# Patient Record
Sex: Female | Born: 1980 | Race: Black or African American | Hispanic: No | Marital: Single | Smoking: Former smoker
Health system: Southern US, Community
[De-identification: ages and names within clinical notes are randomized; demographics above are authoritative.]

## PROBLEM LIST (undated history)

## (undated) DIAGNOSIS — L509 Urticaria, unspecified: Secondary | ICD-10-CM

## (undated) DIAGNOSIS — I10 Essential (primary) hypertension: Secondary | ICD-10-CM

## (undated) HISTORY — PX: REFRACTIVE SURGERY: SHX103

## (undated) HISTORY — DX: Essential (primary) hypertension: I10

## (undated) HISTORY — DX: Urticaria, unspecified: L50.9

---
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
## 2001-01-20 ENCOUNTER — Other Ambulatory Visit: Admission: RE | Admit: 2001-01-20 | Discharge: 2001-01-20 | Payer: Self-pay | Admitting: Obstetrics and Gynecology

## 2001-06-14 ENCOUNTER — Encounter: Admission: RE | Admit: 2001-06-14 | Discharge: 2001-06-14 | Payer: Self-pay | Admitting: Obstetrics and Gynecology

## 2001-07-28 ENCOUNTER — Inpatient Hospital Stay (HOSPITAL_COMMUNITY): Admission: AD | Admit: 2001-07-28 | Discharge: 2001-07-31 | Payer: Self-pay | Admitting: Obstetrics and Gynecology

## 2001-07-28 ENCOUNTER — Encounter (INDEPENDENT_AMBULATORY_CARE_PROVIDER_SITE_OTHER): Payer: Self-pay | Admitting: *Deleted

## 2001-07-30 ENCOUNTER — Encounter (INDEPENDENT_AMBULATORY_CARE_PROVIDER_SITE_OTHER): Payer: Self-pay | Admitting: Specialist

## 2001-08-02 HISTORY — PX: COLPOSCOPY: SHX161

## 2002-02-02 ENCOUNTER — Other Ambulatory Visit: Admission: RE | Admit: 2002-02-02 | Discharge: 2002-02-02 | Payer: Self-pay | Admitting: Obstetrics and Gynecology

## 2003-03-01 ENCOUNTER — Other Ambulatory Visit: Admission: RE | Admit: 2003-03-01 | Discharge: 2003-03-01 | Payer: Self-pay | Admitting: Family Medicine

## 2003-03-22 ENCOUNTER — Inpatient Hospital Stay (HOSPITAL_COMMUNITY): Admission: AD | Admit: 2003-03-22 | Discharge: 2003-03-22 | Payer: Self-pay | Admitting: Obstetrics and Gynecology

## 2003-05-17 ENCOUNTER — Emergency Department (HOSPITAL_COMMUNITY): Admission: EM | Admit: 2003-05-17 | Discharge: 2003-05-17 | Payer: Self-pay | Admitting: Emergency Medicine

## 2003-05-17 ENCOUNTER — Encounter: Payer: Self-pay | Admitting: Emergency Medicine

## 2003-09-20 ENCOUNTER — Emergency Department (HOSPITAL_COMMUNITY): Admission: EM | Admit: 2003-09-20 | Discharge: 2003-09-20 | Payer: Self-pay

## 2004-03-04 ENCOUNTER — Other Ambulatory Visit: Admission: RE | Admit: 2004-03-04 | Discharge: 2004-03-04 | Payer: Self-pay | Admitting: Family Medicine

## 2004-07-23 ENCOUNTER — Emergency Department (HOSPITAL_COMMUNITY): Admission: EM | Admit: 2004-07-23 | Discharge: 2004-07-23 | Payer: Self-pay | Admitting: Family Medicine

## 2005-03-27 ENCOUNTER — Other Ambulatory Visit: Admission: RE | Admit: 2005-03-27 | Discharge: 2005-03-27 | Payer: Self-pay | Admitting: Family Medicine

## 2006-04-14 ENCOUNTER — Other Ambulatory Visit: Admission: RE | Admit: 2006-04-14 | Discharge: 2006-04-14 | Payer: Self-pay | Admitting: Family Medicine

## 2006-12-02 ENCOUNTER — Emergency Department (HOSPITAL_COMMUNITY): Admission: EM | Admit: 2006-12-02 | Discharge: 2006-12-02 | Payer: Self-pay | Admitting: Emergency Medicine

## 2007-05-02 ENCOUNTER — Other Ambulatory Visit: Admission: RE | Admit: 2007-05-02 | Discharge: 2007-05-02 | Payer: Self-pay | Admitting: Family Medicine

## 2008-07-06 ENCOUNTER — Emergency Department (HOSPITAL_COMMUNITY): Admission: EM | Admit: 2008-07-06 | Discharge: 2008-07-06 | Payer: Self-pay | Admitting: Family Medicine

## 2008-11-28 ENCOUNTER — Emergency Department (HOSPITAL_COMMUNITY): Admission: EM | Admit: 2008-11-28 | Discharge: 2008-11-28 | Payer: Self-pay | Admitting: Emergency Medicine

## 2009-05-06 ENCOUNTER — Other Ambulatory Visit: Admission: RE | Admit: 2009-05-06 | Discharge: 2009-05-06 | Payer: Self-pay | Admitting: Family Medicine

## 2009-10-25 ENCOUNTER — Emergency Department (HOSPITAL_COMMUNITY): Admission: EM | Admit: 2009-10-25 | Discharge: 2009-10-25 | Payer: Self-pay | Admitting: Family Medicine
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
## 2010-05-16 ENCOUNTER — Other Ambulatory Visit: Admission: RE | Admit: 2010-05-16 | Discharge: 2010-05-16 | Payer: Self-pay | Admitting: Family Medicine

## 2010-12-19 NOTE — Discharge Summary (Signed)
[facility]  Patient:    Sylvia Roberts, Sylvia Roberts Visit Number: [id number] MRN: [id number]          Service Type: OBS Location: [facility] Attending Physician:  Leonard Schwartz Dictated by:   Wynelle Bourgeois, CNM Adm. Date:  07/28/01 Disc. Date: 07/31/01                             Discharge Summary  ADMITTING DIAGNOSES:          1. Intrauterine pregnancy at 37-1/7 weeks.                               2. Early labor.                               3. Gestational diabetes.                               4. Negative rubella titer.  DISCHARGE DIAGNOSES:          1. Intrauterine pregnancy at 37-1/7 weeks.                               2. Early labor.                               3. Gestational diabetes.                               4. Negative rubella titer.                               5. Nonreassuring fetal heart rate tracing.                               6. Failure to progress.  HOSPITAL PROCEDURES:          1. Epidural anesthesia.                               2. Primary low transverse cesarean section for a                                  viable female infant named Magda Paganini, weighing                                  6 pounds with Apgars of 8 and 8.  HOSPITAL COURSE:              The patient presented in early labor on the morning of July 28, 2001, with a reassuring fetal heart rate.  Her cervix at that time was 3 cm dilated, and she was observed throughout the day.  She developed recurrent variable decelerations in the evening of July 28, 2001.  At that time, her cervix was 4 cm dilated with bulging membranes.  Her  membranes were ________, and IUPC and internal scalp electrode were placed, and amnioinfusion was begun for variable decelerations.  An hour later, the variable decelerations continued despite the amnioinfusion and discontinuation of Pitocin.  Cervix remained at 4 cm and a C-section was recommended for repetitive variable  decelerations unresponsive to measures and the patient elected to proceed.  She had a primary low transverse cesarean section under epidural anesthesia for a viable female infant weighing 6 pounds, and Apgars of 8 and 8 with an EBL of 750 cc and no complications.  The infant was taken to the full-term nursery.  The patient progressed in recovery on day #1 and #2, and on day #3. her chest was clear.  Heart was regular rate and rhythm.  Abdomen was soft and appropriately tender.  Breasts were soft and filling.  Incision was intact. Lochia was small.  Vital signs were stable with a maximum temperature of 98.2. Extremities within normal limits and she was deemed to have received the full benefit of her hospital stay and was discharged home.  DISCHARGE MEDICATIONS:        Motrin 600 mg p.o. q.6h. p.r.n., Tylox one to two p.o. q.3-4h. p.r.n.  DISCHARGE LABORATORY DATA:    White blood cell count 12.1, hemoglobin 10.8, hematocrit 31.1, platelets 157,000.  DISCHARGE INSTRUCTIONS:       Per [facility] hand out.  Discharge follow up at six weeks or p.r.n. at [facility]. Dictated by:   Wynelle Bourgeois, CNM Attending Physician:  Leonard Schwartz DD:  07/31/01 TD:  07/31/01 Job: [phone] [id number]

## 2010-12-19 NOTE — H&P (Signed)
[facility]  Patient:    Sylvia Roberts, Sylvia Roberts Visit Number: [id number] MRN: [id number]          Service Type: OBS Location: 910B [id number] Attending Physician:  Leonard Schwartz Dictated by:   Nigel Bridgeman, C.N.M. Admit Date:  07/28/2001                           History and Physical  HISTORY OF PRESENT ILLNESS:   Sylvia Roberts is a 30 year old gravida 1, para 0, at 37-1/7 weeks who presents with uterine contractions since 2 a.m., now every two to three minutes on presentation.  She noted positive bloody show and positive fetal movement.  The pregnancy has been remarkable for: 1. Gestational diabetes, diet-controlled. 2. History of STDs. 3. History of abnormal Pap. 4. First-trimester drug exposure.  PRENATAL LABORATORY DATA:     Blood type O positive, Rh antibody negative, VDRL nonreactive.  Rubella titer is nonimmune.  Hepatitis B surface antigen is negative.  HIV nonreactive.  Sickle cell test is negative.  GC and chlamydia cultures were negative.  Pap was normal.  The patient had an elevated one-hour glucose-challenge test and had an abnormal three-hour test.  AFP was normal. Hemoglobin upon entering the practice was 12.3.  It was 12.6 at 26 weeks. Group B strep culture was negative at 36 weeks.  EDC of August 13, 2001, was established by last menstrual period and was in agreement with ultrasound at approximately 18 weeks.  HISTORY OF PRESENT PREGNANCY:                    The patient entered care at approximately 10 weeks.  She had an ultrasound at 18 weeks that showed normal growth and development.  At 26 weeks she was referred to a general surgeon for multiple questionable palpable lymph nodes in the left axillary area.  She was referred to Dr. Lurene Shadow.  She had a three-hour GTT at 27 weeks which was abnormal and was transferred the physicians service at that time.  She desired Dr. Pennie Rushing as her primary.  She had an ultrasound at  approximately 32 weeks for fetal growth, with an estimated fetal weight of 33rd-38th percentile.  She had another ultrasound at approximately 35 weeks that showed estimated fetal weight of 2548 g.  She did have external otitis at 35 weeks and was treated with Cortisporin drops.  The rest of her pregnancy care was essentially unremarkable.  OBSTETRICAL HISTORY:          The patient is a primigravida.  PAST MEDICAL HISTORY:         She was on Depo-Provera in 1999.  She started OCPs in 2002.  She had an abnormal Pap in 2000 or 2001, with a colposcopy. She usually has Paps every six months.  She was treated for chlamydia in 2001. She was treated for Trichomonas in 2000 and 2002, March.  She had one yeast infection.  She reports the usual childhood illnesses.  She has a history of constipation.  She has had occasional UTIs.  ALLERGIES:                    No known medication allergies.  FAMILY HISTORY:               Paternal grandmother, aunt, and uncle are hypertensive, on medication.  Her mother and maternal grandmother have anemia. Her paternal grandmother is diet-controlled.  Maternal uncle  had kidney failure.  Maternal aunt donated a kidney to him.  Maternal grandfather has rheumatoid arthritis.  Maternal grandmother had throat and stomach cancer and was a snuff user.  Maternal grandfather had questionable throat cancer and was a heavy drinker.  Paternal grandfather had a stroke.  Paternal uncle and first cousins also have seizures for unknown reasons.  Paternal aunt is also on seizure medications.  SOCIAL HISTORY:               The patient has had some history of depression which occurred prior to the pregnancy.  She did have a loss of a best friend three years ago.  This has improved during her pregnancy.  The patient is a previous smoker of marijuana and cigarettes.  She also used some alcohol in early pregnancy, but then stopped in the middle of May.  The patient denies any other  drug use.  She is single, has a 10th grade education.  Father of the baby is involved and supportive.  His name is Sylvia Roberts.  She is African-American, of the [place] faith.  She has been followed initially by the C.N.M. service but then was transferred to the physician service for gestational diabetes.  PHYSICAL EXAMINATION:  VITAL SIGNS:                  Stable.  The patient is afebrile.  HEENT:                        Within normal limits.  LUNGS:                        Bilateral breath sounds are clear.  HEART:                        Regular rate and rhythm.  Without murmur.  BREASTS:                      Soft and nontender.  ABDOMEN:                      Fundal height is approximately 38 cm.  Estimated fetal weight is 7 to 7-1/2 pounds.  Uterine contractions are every two to three minutes apart, strong quality.  PELVIC:                       Cervix initially was 1 cm, 60%, vertex -1 to -2.  After one hour of ambulation the cervix was a loose 2 cm, 75%, vertex, -1 to -2.  Fetal heart rate is reassuring.  Initially it was reactive, with a negative spontaneous CST.  Now returning from walking it is less reactive but still has a negative spontaneous CST.  There is an occasional mild variable noted.  EXTREMITIES:                  Deep tendon reflexes are 2+ without clonus. There is a trace edema noted.  LABORATORY DATA:              CBG on presentation this morning was 103. Values fasting have been running from 70-90, with two-hour p.c.s running 70-108.  IMPRESSION:                   1. Intrauterine pregnancy at 37-1/7 weeks.  2. Early labor.                               3. Gestational diabetes.                               4. Negative rubella titer.  PLAN:                         1. Admit to birthing suite for consult with                                  Dr. Dierdre Forth as attending physician.                               2.  Routine physician orders.                               3. Gestational diabetes protocol.                                4. IV pain medication p.r.n. with epidural                                  perhaps being desired as labor progresses. Dictated by:   Nigel Bridgeman, C.N.M. Attending Physician:  Leonard Schwartz DD:  07/28/01 TD:  07/28/01 Job: [id number]

## 2010-12-19 NOTE — Op Note (Signed)
[facility]  Patient:    Sylvia Roberts, Sylvia Roberts Visit Number: [id number] MRN: [id number]          Service Type: OBS Location: [facility] Attending Physician:  Leonard Schwartz Dictated by:   Maris Berger. Pennie Rushing, M.D. Proc. Date: 07/28/01                             Operative Report  PREOPERATIVE DIAGNOSES:       Intrauterine pregnancy at [redacted] weeks gestation, gestational diabetes, nonreassuring fetal heart rate tracing, failure to progress in labor.  POSTOPERATIVE DIAGNOSES:      Intrauterine pregnancy at [redacted] weeks gestation, gestational diabetes, nonreassuring fetal heart rate tracing, failure to progress in labor, nuchal cord, rule out abruption.  OPERATION:                    Primary low transverse cesarean section.  SURGEON:                      Vanessa P. Pennie Rushing, M.D.  FIRST ASSISTANT:              Nigel Bridgeman, C.N.M.  ANESTHESIA:                   Epidural.  ESTIMATED BLOOD LOSS:         750 cc.  COMPLICATIONS:                None.  FINDINGS:                     The patient was delivered.  Was a female infant whose name is Magda Paganini weighing 6 pounds with Apgars of 8 and 8 at one and five minutes respectively.  The uterus, tubes, and ovaries were normal for the pregnant state.  The placenta contained an eccentrically inserted three vessel cord with a small area of apparent clot on the membranous side.  PROCEDURE:                    The patient was taken to the operating room after appropriate identification and after a discussion with the patient and father of the baby concerning indications for her cesarean section as well as the risks involved which include, but are not limited to, anesthesia, bleeding, infection, and damage to adjacent organs.  The parents seemed to understand and wished to proceed.  The patient was placed on the operating table.  Her labor epidural and Foley catheter were in place.  The abdomen was prepped with  multiple layers of Betadine and draped as a sterile field with the patient in a supine position with a left lateral tilt.  A transverse incision was made in the abdomen and the abdomen opened in layers.  The peritoneum was entered and the uterus incised approximately 2 cm above the uterovesicle fold.  The infant was then delivered from the occiput transverse position with the aid of a Mityvac vacuum extractor.  The infant was handed off to the awaiting pediatricians after clamping and cutting the cord.  The placenta was noted to have separated from the uterus and was removed from the operative field with gentle traction.  The uterine incision was closed with a running interlocking suture of 0 Vicryl.  An embrocating suture of 0 Vicryl was placed.  Copious irrigation was carried out.  The abdominal peritoneum was closed with a running suture of  2-0 Vicryl.  The rectus muscles were reapproximated in the midline with a figure-of-eight suture of 2-0 Vicryl. The rectus fascia was closed with running sutures from each apex to the midline and tied in the midline.  Subcutaneous tissue was made hemostatic with Bovie cautery and irrigated.  Skin staples were applied to the skin incision. Sterile dressing was applied and the patient taken from the operating room to the recovery room in satisfactory condition having tolerated the procedure well with sponge and instrument counts correct.  The infant went to the full-term nursery and the placenta went to pathology. Dictated by:   Maris Berger. Pennie Rushing, M.D. Attending Physician:  Leonard Schwartz DD:  07/28/01 TD:  07/29/01 Job: [phone] [id number]

## 2011-02-25 ENCOUNTER — Ambulatory Visit: Payer: Self-pay | Admitting: Family Medicine

## 2011-03-06 ENCOUNTER — Ambulatory Visit (INDEPENDENT_AMBULATORY_CARE_PROVIDER_SITE_OTHER): Payer: Self-pay | Admitting: Family Medicine

## 2011-03-06 ENCOUNTER — Encounter: Payer: Self-pay | Admitting: Family Medicine

## 2011-03-06 DIAGNOSIS — E119 Type 2 diabetes mellitus without complications: Secondary | ICD-10-CM | POA: Insufficient documentation

## 2011-03-06 DIAGNOSIS — Z87891 Personal history of nicotine dependence: Secondary | ICD-10-CM | POA: Insufficient documentation

## 2011-03-06 LAB — POCT GLYCOSYLATED HEMOGLOBIN (HGB A1C): Hemoglobin A1C: 6.1

## 2011-03-06 MED ORDER — ETONOGESTREL-ETHINYL ESTRADIOL 0.12-0.015 MG/24HR VA RING: VAGINAL_RING | VAGINAL | Status: DC

## 2011-03-06 NOTE — Assessment & Plan Note (Signed)
Congratulated pt.  Will continue monitoring for relapse.

## 2011-03-06 NOTE — Patient Instructions (Signed)
It was nice to meet you.  Congratulations on quitting smoking.  I will call you with the results of your A1C (diabetes test), and we will decide if you need to be on medications or not.   Please plan to follow up with me in 3 months after you have your [facility] card.  We will do a Well Woman exam with a pap smear then.

## 2011-03-06 NOTE — Assessment & Plan Note (Addendum)
Pt with no insurance and has not yet gotten orange card.  Will check A1c today, may re-start medication depending result.  Then plan to follow up with other risk stratification when she has better access.

## 2011-03-06 NOTE — Progress Notes (Signed)
  Subjective:    Patient ID: Sylvia Roberts, female    DOB: 05/18/1981, 30 y.o.   MRN: [id number]  HPI  Sylvia Roberts presents to establish care.  She lost her medicaid several months back and has not been able to see a doctor since last fall.  She was taking Metformin for Diabetes previously.  She says her blood sugars were only slightly elevated when they started the metformin.  The metformin caused her to have upset stomach and diarrhea, so she tried only taking it once a day, and then stopped taking it all together.  She has not checked her blood sugars recently.  No problems with weight loss, polyuria, polydipsia.    Pt is currently sexually active in a monogamous relationship.  She has tried multiple different contraceptive methods recently, but is on [employer], which she likes.  She also like the patch a lot but her old doctor stopped it because of the increased risk of blood clots.  Patient used to smoke, but quit several months ago.    Review of Systems Negative except stated in HPI.     Objective:   Physical Exam BP 105/70  Pulse 93  Temp(Src) 98 F (36.7 C) (Oral)  Ht 4\' 11"  (1.499 m)  Wt 180 lb 6.4 oz (81.829 kg)  BMI 36.44 kg/m2  LMP 02/08/2011 General appearance: alert, cooperative and no distress Eyes: conjunctivae/corneas clear. PERRL, EOM's intact. Fundi benign. Nose: Nares normal. Septum midline. Mucosa normal. No drainage or sinus tenderness. Throat: lips, mucosa, and tongue normal; teeth and gums normal Neck: no adenopathy, no carotid bruit, no JVD, supple, symmetrical, trachea midline and thyroid not enlarged, symmetric, no tenderness/mass/nodules Lungs: clear to auscultation bilaterally Heart: regular rate and rhythm, S1, S2 normal, no murmur, click, rub or gallop Abdomen: soft, non-tender; bowel sounds normal; no masses,  no organomegaly Extremities: extremities normal, atraumatic, no cyanosis or edema Pulses: 2+ and symmetric       Assessment & Plan:    Contraception- Rx Nuva ring, patient not sure if she can afford it but she does not like pills, mirena, or implanon.  Diabetes mellitus Pt with no insurance and has not yet gotten orange card.  Will check A1c today, may re-start medication depending result.  Then plan to follow up with other risk stratification when she has better access.   History of tobacco abuse Congratulated pt.  Will continue monitoring for relapse.

## 2011-03-10 ENCOUNTER — Telehealth: Payer: Self-pay | Admitting: Family Medicine

## 2011-03-10 NOTE — Telephone Encounter (Signed)
Called patient to let her know A1C was 6.1, no need to be on metformin at this time.  She was not able to afford her Nuva ring yet, but is working with Rudell Cobb to get the orange card.  Advised to use condoms, that we could Rx a cheaper kind of birth control if she wants.  Pt wants to try to get Nuva ring.  Advised once she has the orange card to call the office for a Physical and Pap.

## 2011-07-01 ENCOUNTER — Ambulatory Visit (INDEPENDENT_AMBULATORY_CARE_PROVIDER_SITE_OTHER): Payer: Self-pay | Admitting: Family Medicine

## 2011-07-01 ENCOUNTER — Other Ambulatory Visit (HOSPITAL_COMMUNITY)
Admission: RE | Admit: 2011-07-01 | Discharge: 2011-07-01 | Disposition: A | Discharge status: Home / Self Care | Payer: Self-pay | Source: Ambulatory Visit | Attending: Family Medicine | Admitting: Family Medicine

## 2011-07-01 ENCOUNTER — Encounter: Payer: Self-pay | Admitting: Family Medicine

## 2011-07-01 VITALS — BP 103/71 | HR 84 | Temp 98.2°F | Ht 59.0 in | Wt 187.9 lb

## 2011-07-01 DIAGNOSIS — E119 Type 2 diabetes mellitus without complications: Secondary | ICD-10-CM

## 2011-07-01 DIAGNOSIS — Z7251 High risk heterosexual behavior: Secondary | ICD-10-CM

## 2011-07-01 DIAGNOSIS — N760 Acute vaginitis: Secondary | ICD-10-CM

## 2011-07-01 DIAGNOSIS — Z309 Encounter for contraceptive management, unspecified: Secondary | ICD-10-CM

## 2011-07-01 DIAGNOSIS — Z124 Encounter for screening for malignant neoplasm of cervix: Secondary | ICD-10-CM

## 2011-07-01 DIAGNOSIS — Z01419 Encounter for gynecological examination (general) (routine) without abnormal findings: Secondary | ICD-10-CM | POA: Insufficient documentation

## 2011-07-01 DIAGNOSIS — E669 Obesity, unspecified: Secondary | ICD-10-CM | POA: Insufficient documentation

## 2011-07-01 LAB — POCT URINE PREGNANCY: Preg Test, Ur: NEGATIVE

## 2011-07-01 LAB — POCT GLYCOSYLATED HEMOGLOBIN (HGB A1C): Hemoglobin A1C: 7

## 2011-07-01 MED ORDER — NORGESTIMATE-ETH ESTRADIOL 0.25-35 MG-MCG PO TABS: 1.0000 | ORAL_TABLET | Freq: Every day | ORAL | Status: AC

## 2011-07-01 NOTE — Patient Instructions (Signed)
It was good to see you today.  Your Hemoglobin A1C is  Lab Results  Component Value Date   HGBA1C 7.0 07/01/2011  Remember your goal for A1C is less than 7.  Your goal for fasting morning blood sugar is 80-120.  This is the borderline number for starting medications for diabetes.  I want you to come back in about 3 months.  If you have lost some weight and are eating healthier, we may not have to start medications.     I will send you a letter with your pap results.    I have sent a prescription to your pharmacy for birth control pills.  You should start them the first Sunday after your next period starts, and take one every day, about the same time each day.  You should use condoms until you start them to protect against pregnancy.

## 2011-07-02 DIAGNOSIS — Z309 Encounter for contraceptive management, unspecified: Secondary | ICD-10-CM | POA: Insufficient documentation

## 2011-07-02 LAB — GC/CHLAMYDIA PROBE AMP, GENITAL: Chlamydia, DNA Probe: NEGATIVE

## 2011-07-02 NOTE — Assessment & Plan Note (Signed)
Discussed eating healthy snacks between meals, portion control, and increasing exercise.

## 2011-07-02 NOTE — Progress Notes (Signed)
  Subjective:    Patient ID: Sylvia Roberts, female    DOB: 24-Sep-1980, 30 y.o.   MRN: [id number]  HPI  Sylvia Roberts comes in for her well woman exam and DM follow up.   Health Maintenance: She reports she is due for a pap, but has never had an abnormal one.    DM and obesity- pt reports she knows she has not been eating very healthy during the Thanksgiving Holiday.  She knew she had gained weight, and is disappointed to find out her A1C is 7.  She reports she often eats a small breakfast, and then is starving when she gets to lunch, and when she gets to dinner.  She is not exercising much, but has a plan to start a walking program.  Also, if she can afford it, she wants to join [employer].   Contraceptive Management- patient was unable to get the Nuva Ring prescribed at her last visit.  She has had unprotected intercourse and wants to make sure she is not pregnant.  She has not missed a period.  She would like to start the pill.   Review of Systems Pertinent items noted in HPI.     Objective:   Physical Exam  Vitals reviewed. Constitutional: She is oriented to person, place, and time. She appears well-developed and well-nourished. No distress.  Eyes: Conjunctivae and EOM are normal. Pupils are equal, round, and reactive to light.  Neck: Normal range of motion. Neck supple. No thyromegaly present.  Cardiovascular: Normal rate and regular rhythm.   No murmur heard. Pulmonary/Chest: Effort normal and breath sounds normal.  Abdominal: Soft. Bowel sounds are normal. There is no tenderness.  Genitourinary: Vagina normal and uterus normal. No breast swelling, tenderness, discharge or bleeding. Cervix exhibits no discharge. Right adnexum displays no mass. Left adnexum displays no mass.  Neurological: She is alert and oriented to person, place, and time.  Skin: Skin is warm and dry.          Assessment & Plan:

## 2011-07-02 NOTE — Assessment & Plan Note (Signed)
Pap and STD screen done today.  Exam WNL.

## 2011-07-02 NOTE — Assessment & Plan Note (Signed)
Discussed many different kinds of birth control.  Rx. Sprintec, discussed how to take pills.

## 2011-07-02 NOTE — Assessment & Plan Note (Signed)
Discussed borderline for needing medications.  Patient agrees to work on diet and exercise, and follow up in 3 months, if no improvement or worsened control, will plan to start metformin.

## 2011-07-03 ENCOUNTER — Ambulatory Visit (INDEPENDENT_AMBULATORY_CARE_PROVIDER_SITE_OTHER): Payer: Self-pay | Admitting: *Deleted

## 2011-07-03 DIAGNOSIS — Z23 Encounter for immunization: Secondary | ICD-10-CM

## 2011-07-08 ENCOUNTER — Encounter: Payer: Self-pay | Admitting: Family Medicine

## 2012-09-26 ENCOUNTER — Other Ambulatory Visit: Payer: Self-pay | Admitting: Physician Assistant

## 2012-09-26 ENCOUNTER — Other Ambulatory Visit (HOSPITAL_COMMUNITY)
Admission: RE | Admit: 2012-09-26 | Discharge: 2012-09-26 | Disposition: A | Discharge status: Home / Self Care | Payer: Medicaid Other | Source: Ambulatory Visit | Attending: Family Medicine | Admitting: Family Medicine

## 2012-09-26 DIAGNOSIS — Z0000 Encounter for general adult medical examination without abnormal findings: Secondary | ICD-10-CM | POA: Insufficient documentation

## 2013-03-15 ENCOUNTER — Telehealth: Payer: Self-pay | Admitting: *Deleted

## 2013-03-15 NOTE — Telephone Encounter (Signed)
Letter sent regarding diabetes care Sylvia Haste, RN-BSN

## 2013-11-14 ENCOUNTER — Encounter (HOSPITAL_COMMUNITY): Payer: Self-pay | Admitting: Emergency Medicine

## 2013-11-14 ENCOUNTER — Emergency Department (HOSPITAL_COMMUNITY)
Admission: EM | Admit: 2013-11-14 | Discharge: 2013-11-14 | Disposition: A | Discharge status: Home / Self Care | Payer: Medicaid Other | Source: Home / Self Care

## 2013-11-14 DIAGNOSIS — X58XXXA Exposure to other specified factors, initial encounter: Secondary | ICD-10-CM

## 2013-11-14 DIAGNOSIS — T50905A Adverse effect of unspecified drugs, medicaments and biological substances, initial encounter: Secondary | ICD-10-CM

## 2013-11-14 DIAGNOSIS — T887XXA Unspecified adverse effect of drug or medicament, initial encounter: Secondary | ICD-10-CM

## 2013-11-14 NOTE — Discharge Instructions (Signed)
Reduce janumet as discussed, try activia yogurt and align -probiotic , see your doctor for further eval.

## 2013-11-14 NOTE — ED Notes (Signed)
Pt  Reports  Multiple  Complaints      She  Reports  She  Has  Had          Symptoms  Of  Diarrhea     And  Nausea        With  The  Symptoms      For  sev  Weeks     She     Also  Reports     Being a  Diabetic          Who  Has  Been  C/o  Swelling of  Feet  And  Legs       She  Is  On  An  Anti biotics       And  Recently  Had  Her  Diabetes  meds  Changed

## 2013-11-14 NOTE — ED Provider Notes (Signed)
CSN: [id number]     Arrival date & time 11/14/13  1353 History   None    Chief Complaint  Patient presents with  . Diarrhea   (Consider location/radiation/quality/duration/timing/severity/associated sxs/prior Treatment) Patient is a 33 y.o. female presenting with diarrhea. The history is provided by the patient.  Diarrhea Quality:  Watery Severity:  Moderate Onset quality:  Gradual Progression:  Unchanged Associated symptoms: no abdominal pain, no fever and no vomiting   Risk factors: recent antibiotic use   Risk factors comment:  Also sx started after being started on janumet xr 2qd.   Past Medical History  Diagnosis Date  . Diabetes mellitus   . Glaucoma    Past Surgical History  Procedure Laterality Date  . Cesarean section     Family History  Problem Relation Age of Onset  . Anemia Mother   . Diabetes Maternal Aunt   . Hypertension Maternal Aunt   . Diabetes Paternal Aunt   . Hypertension Paternal Aunt   . Cancer Maternal Grandmother   . Cancer Maternal Grandfather   . Diabetes Paternal Grandmother   . Hypertension Paternal Grandmother   . Stroke Paternal Grandfather    History  Substance Use Topics  . Smoking status: Current Some Day Smoker -- 0.10 packs/day    Types: Cigarettes    Last Attempt to Quit: 12/04/2010  . Smokeless tobacco: Never Used  . Alcohol Use: 0.0 oz/week    2-3 Glasses of wine per week   OB History   Grav Para Term Preterm Abortions TAB SAB Ect Mult Living                 Review of Systems  Unable to perform ROS Constitutional: Negative.  Negative for fever.  Gastrointestinal: Positive for nausea and diarrhea. Negative for vomiting, abdominal pain, constipation and abdominal distention.  Musculoskeletal: Negative.   Skin: Negative.     Allergies  Review of patient's allergies indicates no known allergies.  Home Medications   Prior to Admission medications   Medication Sig Start Date End Date Taking? Authorizing Provider   Atorvastatin Calcium (LIPITOR PO) Take by mouth.   Yes Historical Provider, MD  Cephalexin (KEFLEX PO) Take by mouth.   Yes Historical Provider, MD  HYDROCHLOROTHIAZIDE PO Take by mouth.   Yes Historical Provider, MD  SitaGLIPtin-MetFORMIN HCl (JANUMET PO) Take by mouth.   Yes Historical Provider, MD  norgestimate-ethinyl estradiol (SPRINTEC 28) 0.25-35 MG-MCG tablet Take 1 tablet by mouth daily. 07/01/11 06/30/12  Ardyth Galachel Chamberlain, MD   BP 116/71  Pulse 94  Temp(Src) 98.7 F (37.1 C) (Oral)  Resp 16  SpO2 100%  LMP 11/04/2013 Physical Exam  Nursing note and vitals reviewed. Constitutional: She is oriented to person, place, and time. She appears well-developed and well-nourished.  HENT:  Mouth/Throat: Oropharynx is clear and moist.  Neck: Normal range of motion. Neck supple.  Pulmonary/Chest: Effort normal and breath sounds normal.  Abdominal: Soft. Bowel sounds are normal. She exhibits no distension and no mass. There is no tenderness. There is no rebound and no guarding.  Neurological: She is alert and oriented to person, place, and time.  Skin: Skin is warm and dry.    ED Course  Procedures (including critical care time) Labs Review Labs Reviewed - No data to display  Results for orders placed in visit on 07/01/11  GC/CHLAMYDIA PROBE AMP, GENITAL      Result Value Ref Range   Chlamydia, DNA Probe NEGATIVE     GC Probe Amp, Genital  NEGATIVE    POCT GLYCOSYLATED HEMOGLOBIN (HGB A1C)      Result Value Ref Range   Hemoglobin A1C 7.0    POCT URINE PREGNANCY      Result Value Ref Range   Preg Test, Ur Negative     Imaging Review No results found.   MDM   1. Adverse effects of medication        Linna HoffJames D Tremon Sainvil, MD 11/24/13 1011

## 2013-12-17 ENCOUNTER — Encounter (HOSPITAL_COMMUNITY): Payer: Self-pay | Admitting: Emergency Medicine

## 2013-12-17 DIAGNOSIS — R209 Unspecified disturbances of skin sensation: Secondary | ICD-10-CM | POA: Insufficient documentation

## 2013-12-17 DIAGNOSIS — F17200 Nicotine dependence, unspecified, uncomplicated: Secondary | ICD-10-CM | POA: Insufficient documentation

## 2013-12-17 DIAGNOSIS — E1149 Type 2 diabetes mellitus with other diabetic neurological complication: Secondary | ICD-10-CM | POA: Insufficient documentation

## 2013-12-17 DIAGNOSIS — E1142 Type 2 diabetes mellitus with diabetic polyneuropathy: Secondary | ICD-10-CM | POA: Insufficient documentation

## 2013-12-17 NOTE — ED Notes (Addendum)
Pt reports left arm numbness starting at 1700 today. Neuro intact. Grips equal. 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 AO x4. Note: Md Wofford made aware of symptoms. Pt not appropriate for code stroke at this time.

## 2013-12-18 ENCOUNTER — Emergency Department (HOSPITAL_COMMUNITY)
Admission: EM | Admit: 2013-12-18 | Discharge: 2013-12-18 | Discharge status: Against Medical Advice | Payer: Medicaid Other | Attending: Emergency Medicine | Admitting: Emergency Medicine

## 2013-12-18 NOTE — ED Notes (Signed)
Informed that pt left, "will see MD tomorrow".

## 2014-02-09 ENCOUNTER — Other Ambulatory Visit (HOSPITAL_COMMUNITY)
Admission: RE | Admit: 2014-02-09 | Discharge: 2014-02-09 | Disposition: A | Discharge status: Home / Self Care | Payer: Medicaid Other | Source: Ambulatory Visit | Attending: Family Medicine | Admitting: Family Medicine

## 2014-02-09 ENCOUNTER — Other Ambulatory Visit: Payer: Self-pay | Admitting: Physician Assistant

## 2014-02-09 DIAGNOSIS — Z01419 Encounter for gynecological examination (general) (routine) without abnormal findings: Secondary | ICD-10-CM | POA: Insufficient documentation

## 2014-02-13 LAB — CYTOLOGY - PAP

## 2014-09-10 ENCOUNTER — Emergency Department (HOSPITAL_COMMUNITY)
Admission: EM | Admit: 2014-09-10 | Discharge: 2014-09-10 | Disposition: A | Discharge status: Home / Self Care | Payer: Medicaid Other | Source: Home / Self Care | Attending: Family Medicine | Admitting: Family Medicine

## 2014-09-10 ENCOUNTER — Encounter (HOSPITAL_COMMUNITY): Payer: Self-pay | Admitting: Emergency Medicine

## 2014-09-10 DIAGNOSIS — T485X5A Adverse effect of other anti-common-cold drugs, initial encounter: Secondary | ICD-10-CM

## 2014-09-10 DIAGNOSIS — J0100 Acute maxillary sinusitis, unspecified: Secondary | ICD-10-CM

## 2014-09-10 MED ORDER — FEXOFENADINE-PSEUDOEPHED ER 60-120 MG PO TB12: 1.0000 | ORAL_TABLET | Freq: Two times a day (BID) | ORAL | Status: AC

## 2014-09-10 MED ORDER — AMOXICILLIN 875 MG PO TABS: 875.0000 mg | ORAL_TABLET | Freq: Two times a day (BID) | ORAL | Status: DC

## 2014-09-10 MED ORDER — IPRATROPIUM BROMIDE 0.06 % NA SOLN: 2.0000 | NASAL | Status: AC | PRN

## 2014-09-10 MED ORDER — MOMETASONE FUROATE 50 MCG/ACT NA SUSP: 2.0000 | Freq: Every day | NASAL | Status: AC

## 2014-09-10 NOTE — ED Notes (Signed)
Patient is not feeling well and has multiple complaints and requests:  Not sleeping well for 1-2 weeks, feeling weak and drained, c/o chronic allergy symptoms, leg cramps, spider bites?, diarrhea/abdominal cramping

## 2014-09-10 NOTE — Discharge Instructions (Signed)

## 2014-09-10 NOTE — ED Provider Notes (Signed)
CSN: [id number]     Arrival date & time 09/10/14  1530 History   First MD Initiated Contact with Patient 09/10/14 1725     Chief Complaint  Patient presents with  . URI   (Consider location/radiation/quality/duration/timing/severity/associated sxs/prior Treatment) HPI           34 year old female presents with a wide variety of complaints. This includes nasal congestion, sneezing, sinus pressure, fatigue, not sleeping well, leg cramps, spider bites, abdominal cramps, diarrhea. She has been using Afrin nasal spray about every 2-4 hours for the past year. She would like to focus on her allergies and sinuses.  For the past 2 weeks she has increasing sinus pressure, increasing nasal congestion, rhinorrhea, increasing fatigue. She is taking some over-the-counter medications without relief. She is also continuing to use the Afrin spray which is not helping as much. No fever  Past Medical History  Diagnosis Date  . Diabetes mellitus   . Glaucoma    Past Surgical History  Procedure Laterality Date  . Cesarean section     Family History  Problem Relation Age of Onset  . Anemia Mother   . Diabetes Maternal Aunt   . Hypertension Maternal Aunt   . Diabetes Paternal Aunt   . Hypertension Paternal Aunt   . Cancer Maternal Grandmother   . Cancer Maternal Grandfather   . Diabetes Paternal Grandmother   . Hypertension Paternal Grandmother   . Stroke Paternal Grandfather    History  Substance Use Topics  . Smoking status: Current Some Day Smoker -- 0.10 packs/day    Types: Cigarettes    Last Attempt to Quit: 12/04/2010  . Smokeless tobacco: Never Used  . Alcohol Use: 0.0 oz/week    2-3 Glasses of wine per week   OB History    No data available     Review of Systems  Constitutional: Positive for fatigue. Negative for fever and chills.  HENT: Positive for congestion, ear pain, postnasal drip, rhinorrhea, sinus pressure, sneezing and sore throat. Negative for ear discharge, facial  swelling, hearing loss, nosebleeds and trouble swallowing.   Respiratory: Positive for cough. Negative for shortness of breath.   Cardiovascular: Negative for chest pain.  Gastrointestinal: Positive for abdominal pain and diarrhea. Negative for nausea and vomiting.  Skin: Negative for rash.  Psychiatric/Behavioral: Positive for sleep disturbance.  All other systems reviewed and are negative.   Allergies  Review of patient's allergies indicates no known allergies.  Home Medications   Prior to Admission medications   Medication Sig Start Date End Date Taking? Authorizing Provider  oxymetazoline (AFRIN) 0.05 % nasal spray Place 1 spray into both nostrils 2 (two) times daily.   Yes Historical Provider, MD  amoxicillin (AMOXIL) 875 MG tablet Take 1 tablet (875 mg total) by mouth 2 (two) times daily. 09/10/14   Graylon GoodZachary H Damere Brandenburg, PA-C  Atorvastatin Calcium (LIPITOR PO) Take by mouth.    Historical Provider, MD  Cephalexin (KEFLEX PO) Take by mouth.    Historical Provider, MD  fexofenadine-pseudoephedrine (ALLEGRA-D) 60-120 MG per tablet Take 1 tablet by mouth every 12 (twelve) hours. 09/10/14   Graylon GoodZachary H Tanaka Gillen, PA-C  HYDROCHLOROTHIAZIDE PO Take by mouth.    Historical Provider, MD  ipratropium (ATROVENT) 0.06 % nasal spray Place 2 sprays into both nostrils every 4 (four) hours as needed for rhinitis. 09/10/14   Adrian BlackwaterZachary H Shadiyah Wernli, PA-C  mometasone (NASONEX) 50 MCG/ACT nasal spray Place 2 sprays into the nose daily. 09/10/14   Graylon GoodZachary H Ean Gettel, PA-C  norgestimate-ethinyl estradiol (  SPRINTEC 28) 0.25-35 MG-MCG tablet Take 1 tablet by mouth daily. 07/01/11 06/30/12  Ardyth Gal, MD  SitaGLIPtin-MetFORMIN HCl (JANUMET PO) Take by mouth.    Historical Provider, MD   BP 120/84 mmHg  Pulse 86  Temp(Src) 98.1 F (36.7 C) (Oral)  Resp 18  SpO2 97%  LMP 08/18/2014 Physical Exam  Constitutional: She is oriented to person, place, and time. Vital signs are normal. She appears well-developed and  well-nourished. No distress.  HENT:  Head: Normocephalic and atraumatic.  Right Ear: External ear normal.  Left Ear: External ear normal.  Nose: Right sinus exhibits maxillary sinus tenderness. Right sinus exhibits no frontal sinus tenderness. Left sinus exhibits maxillary sinus tenderness. Left sinus exhibits no frontal sinus tenderness.  Mouth/Throat: Uvula is midline and oropharynx is clear and moist. No oropharyngeal exudate.  Eyes: Conjunctivae are normal. Right eye exhibits no discharge. Left eye exhibits no discharge. No scleral icterus.  Neck: Normal range of motion. Neck supple.  Cardiovascular: Normal rate, regular rhythm and normal heart sounds.   Pulmonary/Chest: Effort normal and breath sounds normal. No respiratory distress.  Lymphadenopathy:    She has no cervical adenopathy.  Neurological: She is alert and oriented to person, place, and time. She has normal strength. Coordination normal.  Skin: Skin is warm and dry. No rash noted. She is not diaphoretic.  Psychiatric: She has a normal mood and affect. Judgment normal.  Nursing note and vitals reviewed.   ED Course  Procedures (including critical care time) Labs Review Labs Reviewed - No data to display  Imaging Review No results found.   MDM   1. Acute maxillary sinusitis, recurrence not specified   2. Oxymetazoline adverse reaction, initial encounter    Maxillary sinusitis, treat with amoxicillin as well as symptomatically. Advised her to stop Afrin. Discussed with her strategies to do this, including tapering/mixing with normal saline spray. Will use Nasonex and Atrovent spray to try to control her symptoms. Follow-up when necessary   Meds ordered this encounter  Medications  . oxymetazoline (AFRIN) 0.05 % nasal spray    Sig: Place 1 spray into both nostrils 2 (two) times daily.  Marland Kitchen amoxicillin (AMOXIL) 875 MG tablet    Sig: Take 1 tablet (875 mg total) by mouth 2 (two) times daily.    Dispense:  14 tablet     Refill:  0  . fexofenadine-pseudoephedrine (ALLEGRA-D) 60-120 MG per tablet    Sig: Take 1 tablet by mouth every 12 (twelve) hours.    Dispense:  30 tablet    Refill:  2  . mometasone (NASONEX) 50 MCG/ACT nasal spray    Sig: Place 2 sprays into the nose daily.    Dispense:  17 g    Refill:  12  . ipratropium (ATROVENT) 0.06 % nasal spray    Sig: Place 2 sprays into both nostrils every 4 (four) hours as needed for rhinitis.    Dispense:  15 mL    Refill:  1       Graylon Good, PA-C 09/10/14 1802

## 2014-11-14 ENCOUNTER — Ambulatory Visit (INDEPENDENT_AMBULATORY_CARE_PROVIDER_SITE_OTHER): Payer: Medicaid Other | Admitting: Podiatry

## 2014-11-14 ENCOUNTER — Ambulatory Visit (INDEPENDENT_AMBULATORY_CARE_PROVIDER_SITE_OTHER): Payer: Medicaid Other

## 2014-11-14 ENCOUNTER — Encounter: Payer: Self-pay | Admitting: Podiatry

## 2014-11-14 VITALS — BP 114/77 | HR 91 | Resp 18

## 2014-11-14 DIAGNOSIS — R52 Pain, unspecified: Secondary | ICD-10-CM | POA: Diagnosis not present

## 2014-11-14 DIAGNOSIS — M722 Plantar fascial fibromatosis: Secondary | ICD-10-CM | POA: Diagnosis not present

## 2014-11-14 DIAGNOSIS — M79672 Pain in left foot: Secondary | ICD-10-CM

## 2014-11-14 NOTE — Progress Notes (Signed)
   Subjective:    Patient ID: Renetta Chalkranista S Menter, female    DOB: 04-Aug-1980, 34 y.o.   MRN: [id number]  HPI  34 year old female presents the office with complaints of left foot pain which has been ongoing for greater than one year. She states that she has pain to her foot particular after rest upon ambulation. She doesn't answer more she walks the pain is relieved. She states that she has pain to the bottom arch of her foot as well as the midfoot on the left side. She denies any history of injury or trauma. She denies any swelling or redness overlying the area. She states that her last HbA1c was 9. She denies any treatment or numbness. Denies any history of ulceration. Denies any claudication symptoms. No other complaints at this time.  Review of Systems  Eyes: Positive for itching.  Genitourinary: Positive for frequency.  Psychiatric/Behavioral: The patient is nervous/anxious.   All other systems reviewed and are negative.      Objective:   Physical Exam AAO 3, NAD DP/PT pulses palpable, CRT less than 3 seconds Protective sensation intact with Simms Weinstein monofilament, vibratory sensation intact, Achilles tendon reflex intact. At this time there is no areas of pinpoint bony tenderness or pain with vibratory sensation. There is no pain with range of motion of the ankle, subtalar, midfoot, MTPJ's. Subjectively there is tenderness along the medial band of the plantar fascia within the arch of the foot however at this time is not symptomatic. Also subjectively there is tenderness on the dorsal midfoot although there is no tenderness at this time. There is no overlying edema, erythema, increased warmth. There is decreased medial arch height upon weightbearing bilaterally with equinus present. MMT 5/5, ROM WNL No open lesions or pre-ulcerative lesions identified bilaterally No pain with calf compression, swelling, warmth, erythema.     Assessment & Plan:  34 year old female left foot pain,  likely biomechanical in nature -X-rays were obtained and reviewed with the patient -Treatment options were discussed the alternatives, risks, competitions. -I  believe that the patient would benefit from orthotics to help support her foot type and help take pressure off the midfoot as well as the plantar fascia. Discussed custom versus over-the-counter orthotics. She will look approximate over-the-counter pair. -Discussed stretching exercises as well to help stretch the plantar fascia and for equinus. -Follow-up left orthotics or sooner if any problems are to arise. In the meantime occur to call the office and requested to concerns or any changes symptoms. Follow-up with PCP for other issues mentioned in the ROS.

## 2015-06-24 ENCOUNTER — Encounter (HOSPITAL_COMMUNITY): Payer: Self-pay | Admitting: Emergency Medicine

## 2015-06-24 ENCOUNTER — Emergency Department (HOSPITAL_COMMUNITY)
Admission: EM | Admit: 2015-06-24 | Discharge: 2015-06-24 | Disposition: A | Discharge status: Home / Self Care | Payer: Medicaid Other | Source: Home / Self Care

## 2015-06-24 DIAGNOSIS — L03116 Cellulitis of left lower limb: Secondary | ICD-10-CM | POA: Diagnosis not present

## 2015-06-24 MED ORDER — HYDRALAZINE HCL 10 MG PO TABS: 10.0000 mg | ORAL_TABLET | Freq: Three times a day (TID) | ORAL | Status: DC

## 2015-06-24 MED ORDER — CEPHALEXIN 500 MG PO CAPS: 500.0000 mg | ORAL_CAPSULE | Freq: Three times a day (TID) | ORAL | Status: DC

## 2015-06-24 NOTE — Discharge Instructions (Signed)
Cellulitis °Cellulitis is an infection of the skin and the tissue under the skin. The infected area is usually red and tender. This happens most often in the arms and lower legs. °HOME CARE  °· Take your antibiotic medicine as told. Finish the medicine even if you start to feel better. °· Keep the infected arm or leg raised (elevated). °· Put a warm cloth on the area up to 4 times per day. °· Only take medicines as told by your doctor. °· Keep all doctor visits as told. °GET HELP IF: °· You see red streaks on the skin coming from the infected area. °· Your red area gets bigger or turns a dark color. °· Your bone or joint under the infected area is painful after the skin heals. °· Your infection comes back in the same area or different area. °· You have a puffy (swollen) bump in the infected area. °· You have new symptoms. °· You have a fever. °GET HELP RIGHT AWAY IF:  °· You feel very sleepy. °· You throw up (vomit) or have watery poop (diarrhea). °· You feel sick and have muscle aches and pains. °  °This information is not intended to replace advice given to you by your health care provider. Make sure you discuss any questions you have with your health care provider. °  °Document Released: 01/06/2008 Document Revised: 04/10/2015 Document Reviewed: 10/05/2011 °Elsevier Interactive Patient Education ©2016 [employer] ° °

## 2015-06-24 NOTE — ED Notes (Signed)
C/o rash to LLE onset Sunday morning  Sx include itching, swelling, redness A&O x4.... No acute distress.

## 2015-09-24 ENCOUNTER — Encounter (HOSPITAL_COMMUNITY): Payer: Self-pay | Admitting: *Deleted

## 2015-09-24 ENCOUNTER — Emergency Department (HOSPITAL_COMMUNITY)
Admission: EM | Admit: 2015-09-24 | Discharge: 2015-09-24 | Disposition: A | Discharge status: Home / Self Care | Payer: Medicaid Other | Source: Home / Self Care | Attending: Family Medicine | Admitting: Family Medicine

## 2015-09-24 DIAGNOSIS — M542 Cervicalgia: Secondary | ICD-10-CM | POA: Diagnosis not present

## 2015-09-24 DIAGNOSIS — M545 Low back pain: Secondary | ICD-10-CM

## 2015-09-24 DIAGNOSIS — R079 Chest pain, unspecified: Secondary | ICD-10-CM

## 2015-09-24 MED ORDER — CYCLOBENZAPRINE HCL 5 MG PO TABS: 5.0000 mg | ORAL_TABLET | Freq: Three times a day (TID) | ORAL | Status: DC | PRN

## 2015-09-24 MED ORDER — DICLOFENAC POTASSIUM 50 MG PO TABS: 50.0000 mg | ORAL_TABLET | Freq: Three times a day (TID) | ORAL | Status: DC

## 2015-09-24 NOTE — Discharge Instructions (Signed)
Heat for soreness and medicine as prescribed, see your doctorif further problems.
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
## 2017-09-20 ENCOUNTER — Other Ambulatory Visit (HOSPITAL_COMMUNITY)
Admission: RE | Admit: 2017-09-20 | Discharge: 2017-09-20 | Disposition: A | Discharge status: Home / Self Care | Payer: Medicaid Other | Source: Ambulatory Visit | Attending: Obstetrics and Gynecology | Admitting: Obstetrics and Gynecology

## 2017-09-20 ENCOUNTER — Other Ambulatory Visit: Payer: Self-pay | Admitting: Obstetrics and Gynecology

## 2017-09-20 DIAGNOSIS — Z124 Encounter for screening for malignant neoplasm of cervix: Secondary | ICD-10-CM | POA: Insufficient documentation

## 2017-09-23 LAB — CYTOLOGY - PAP
DIAGNOSIS: NEGATIVE
HPV (WINDOPATH): NOT DETECTED

## 2018-03-21 ENCOUNTER — Encounter: Payer: Self-pay | Admitting: Allergy

## 2018-03-21 ENCOUNTER — Ambulatory Visit: Payer: Medicaid Other | Admitting: Allergy

## 2018-03-21 VITALS — BP 112/70 | HR 88 | Temp 98.6°F | Resp 18 | Ht 58.25 in | Wt 184.6 lb

## 2018-03-21 DIAGNOSIS — J309 Allergic rhinitis, unspecified: Secondary | ICD-10-CM | POA: Diagnosis not present

## 2018-03-21 DIAGNOSIS — H1010 Acute atopic conjunctivitis, unspecified eye: Secondary | ICD-10-CM

## 2018-03-21 MED ORDER — AZELASTINE HCL 0.1 % NA SOLN: 2.0000 | Freq: Two times a day (BID) | NASAL | 5 refills | Status: DC

## 2018-03-21 MED ORDER — OLOPATADINE HCL 0.7 % OP SOLN: 1.0000 [drp] | Freq: Every day | OPHTHALMIC | 5 refills | Status: AC | PRN

## 2018-03-21 NOTE — Patient Instructions (Addendum)
Allergic rhinoconjunctivitis - Environmental allergies testing today is positive for dust mites - Allergen avoidance measures discussed and provided today - continue long-acting antihistamines like allergra 180mg , xyzal 5mg  or zyrtec 10mg  daily - for nasal congestion use nasonex 2 sprays each nostril daily.  Use for 1-2 weeks at a time before stopping once symptoms improve - for nasal drainage/post-nasal drip use nasal antihistamine, Astelin 2 sprays each nostril up to twice a day as needed - reserved your nasal atrovent for when you have breakthrough symptoms of nasal congestion and/or drainage - for itchy/watery/red eyes use Pazeo 1 drop each eye  Follow-up 6 months or sooner if needed

## 2018-03-21 NOTE — Progress Notes (Signed)
New Patient Note  RE: Sylvia Roberts MRN: [id number] DOB: 11-17-1980 Date of Office Visit: 03/21/2018  Referring provider: Tally JoeSwayne, David, MD Primary care provider: Carolin CoyWestermann, Carola, MD  Chief Complaint: sinus issues, nasal congestion  History of present illness: Sylvia Chalkranista S Barnett is a 37 y.o. female presenting today for consultation for allergies.    She reports burning eyes and nose, nasal drainage, ear discomfort, sore throat/scratchy throat, sinus HA.  She has been prescribed nasonex that initially she was using daily but now is taking as needed for congestion.  Also was prescribed atrovent nasal spray that she was using 2-3 times a day but now doing as needed as well. She has taken cetirizine but changed to allegra as she was taking cetirizine daily for years.  She states she also tried taking a decongestant as well which has helped relieve her HA and sinus congestion.  She thinks she may have taken singulair many years ago.  Symptoms are worse in her home.  She has old carpeting and is hoping that her apartment complex will replace her carpeting.  No history of asthma or eczema or food allergy.   She does report some lip tingling sensation with fruit ingestion (watermelon, pineapple).   She has glaucoma and has eye drops from her eye doctor.    Review of systems: Review of Systems  Constitutional: Negative for chills, fever and malaise/fatigue.  HENT: Positive for congestion, ear pain, sinus pain and sore throat. Negative for ear discharge, hearing loss, nosebleeds and tinnitus.   Eyes: Negative for pain, discharge and redness.  Respiratory: Negative for cough, shortness of breath, wheezing and stridor.   Cardiovascular: Negative for chest pain.  Gastrointestinal: Negative for abdominal pain, constipation, diarrhea, heartburn, nausea and vomiting.  Musculoskeletal: Negative for joint pain.  Skin: Negative for itching and rash.  Neurological: Positive for headaches. Negative  for dizziness.    All other systems negative unless noted above in HPI  Past medical history: Past Medical History:  Diagnosis Date  . Diabetes mellitus   . Glaucoma   . Urticaria     Past surgical history: Past Surgical History:  Procedure Laterality Date  . CESAREAN SECTION    . CESAREAN SECTION    . COLPOSCOPY Bilateral 08/02/2001  . REFRACTIVE SURGERY Bilateral     Family history:  Family History  Problem Relation Age of Onset  . Anemia Mother   . Diabetes Maternal Aunt   . Hypertension Maternal Aunt   . Diabetes Paternal Aunt   . Hypertension Paternal Aunt   . Cancer Maternal Grandmother   . Cancer Maternal Grandfather   . Diabetes Paternal Grandmother   . Hypertension Paternal Grandmother   . Stroke Paternal Grandfather     Social history: She lives in an apartment with old carpeting with electric heating and central cooling.  There are no pets in the home.  There are cats and dogs outside the home in her apartment complex.  There is no concern for roaches in the apartment.  She is an Data processing managerassistant teacher.  She denies a smoking history.  Medication List: Allergies as of 03/21/2018      Reactions   Metformin And Related Nausea And Vomiting      Medication List        Accurate as of 03/21/18  5:33 PM. Always use your most recent med list.          bimatoprost 0.03 % ophthalmic solution Commonly known as:  LUMIGAN Place 1 drop  into both eyes at bedtime.   cetirizine 10 MG tablet Commonly known as:  ZYRTEC Take 10 mg by mouth daily.   cholecalciferol 1000 units tablet Commonly known as:  VITAMIN D Take 1,000 Units by mouth daily.   cyclobenzaprine 5 MG tablet Commonly known as:  FLEXERIL Take 1 tablet (5 mg total) by mouth 3 (three) times daily as needed for muscle spasms.   FARXIGA 10 MG Tabs tablet Generic drug:  dapagliflozin propanediol Take 10 mg by mouth daily.   fexofenadine-pseudoephedrine 60-120 MG 12 hr tablet Commonly known as:   ALLEGRA-D Take 1 tablet by mouth every 12 (twelve) hours.   ibuprofen 200 MG tablet Commonly known as:  ADVIL,MOTRIN Take 200 mg by mouth every 6 (six) hours as needed.   ipratropium 0.06 % nasal spray Commonly known as:  ATROVENT Place 2 sprays into both nostrils every 4 (four) hours as needed for rhinitis.   LIPITOR PO Take by mouth.   mometasone 50 MCG/ACT nasal spray Commonly known as:  NASONEX Place 2 sprays into the nose daily.   multivitamin with minerals Tabs tablet Take 1 tablet by mouth daily.   multivitamins with iron Tabs tablet Take 1 tablet by mouth daily.   norgestimate-ethinyl estradiol 0.25-35 MG-MCG tablet Commonly known as:  ORTHO-CYCLEN,SPRINTEC,PREVIFEM Take 1 tablet by mouth daily.   pioglitazone 15 MG tablet Commonly known as:  ACTOS Take 15 mg by mouth daily.       Known medication allergies: Allergies  Allergen Reactions  . Metformin And Related Nausea And Vomiting     Physical examination: Blood pressure 112/70, pulse 88, temperature 98.6 F (37 C), temperature source Oral, resp. rate 18, height 4' 10.25" (1.48 m), weight 184 lb 9.6 oz (83.7 kg), SpO2 99 %.  General: Alert, interactive, in no acute distress. HEENT: PERRLA, TMs pearly gray, turbinates moderately edematous without discharge, post-pharynx non erythematous. Neck: Supple without lymphadenopathy. Lungs: Clear to auscultation without wheezing, rhonchi or rales. {no increased work of breathing. CV: Normal S1, S2 without murmurs. Abdomen: Nondistended, nontender. Skin: Warm and dry, without lesions or rashes. Extremities:  No clubbing, cyanosis or edema. Neuro:   Grossly intact.  Diagnositics/Labs:  Allergy testing: Environmental allergy skin prick testing is positive to d. Denzil MagnusonFarina.  Intradermal testing is negative.  Allergy testing results were read and interpreted by provider, documented by clinical staff.   Assessment and plan:   Allergic rhinoconjunctivitis -  Environmental allergies testing today is positive for dust mites - Allergen avoidance measures discussed and provided today - continue long-acting antihistamines like allergra 180mg , xyzal 5mg  or zyrtec 10mg  daily - for nasal congestion use nasonex 2 sprays each nostril daily.  Use for 1-2 weeks at a time before stopping once symptoms improve - for nasal drainage/post-nasal drip use nasal antihistamine, Astelin 2 sprays each nostril up to twice a day as needed - reserved your nasal atrovent for when you have breakthrough symptoms of nasal congestion and/or drainage - for itchy/watery/red eyes use Pazeo 1 drop each eye   Follow-up 6 months or sooner if needed   No follow-ups on file.  I appreciate the opportunity to take part in Kimberleigh's care. Please do not hesitate to contact me with questions.  Sincerely,   Margo AyeShaylar Charmane Protzman, MD Allergy/Immunology [facility]

## 2018-03-22 ENCOUNTER — Telehealth: Payer: Self-pay | Admitting: Allergy

## 2018-03-22 NOTE — Telephone Encounter (Signed)
I called and left a voicemail asking the patient to call back and discuss.  

## 2018-03-22 NOTE — Telephone Encounter (Signed)
Patient was seen yesterday, 8-19, by Dr. Delorse LekPadgett. She had allergy testing done. She did not itch yesterday, but is itching today on her arms and back. She took a benadryl, but it is not working. She would like to know if there is something else she can try. She will be going to class today, from 3-5. If someone calls back between those hours, she said just leave a detailed message.

## 2018-03-22 NOTE — Telephone Encounter (Signed)
Has she tried either the zyrtec, allegra or xyzal recommended and listed on AVS?  She can even take either of those medications twice a day with itching.

## 2018-03-22 NOTE — Telephone Encounter (Signed)
Dr. Padgett will you please advise.  

## 2018-03-25 NOTE — Telephone Encounter (Signed)
Unable to leave message

## 2018-03-25 NOTE — Telephone Encounter (Signed)
I spoke with Dr. Delorse LekPadgett and she advised to do allegra bid, benadryl every 4 hours only if needed, ice pack, topical hydrocortisone cream. Patient repeated this info back to me and is agreeable with plan. Will call us back if any further issues or if still no better Wednesday.

## 2018-09-15 ENCOUNTER — Ambulatory Visit: Payer: Medicaid Other | Admitting: Allergy

## 2018-09-15 ENCOUNTER — Encounter: Payer: Self-pay | Admitting: Allergy

## 2018-09-15 VITALS — BP 122/82 | HR 103 | Resp 16 | Ht 59.0 in | Wt 186.0 lb

## 2018-09-15 DIAGNOSIS — H1013 Acute atopic conjunctivitis, bilateral: Secondary | ICD-10-CM | POA: Diagnosis not present

## 2018-09-15 DIAGNOSIS — J3089 Other allergic rhinitis: Secondary | ICD-10-CM

## 2018-09-15 NOTE — Progress Notes (Signed)
Follow-up Note  RE: Sylvia Chalkranista S Nachreiner MRN: [id number] DOB: 01-25-1981 Date of Office Visit: 09/15/2018   History of present illness: Sylvia Roberts is a 38 y.o. female presenting today for follow-up of allergic rhinitis with conjunctivitis.  She was last seen in the office on 03/21/18 by myself.  Since this visit she denies any major health changes, surgeries or hospitalizations.  She states that her apartment complex has not removed her carpeting yet.  She reports she is most symptomatic when she is at home.  She still reports symptoms of nasal congestion, drainage primarily.  She does feel that Allegra works the best in controlling symptoms out of claritin and zyrtec.  However she states it is very expensive to purchase.  She states she does use the Flonase as needed for congestion but states she was not sure when to use the other nasal spray (Astelin).  She also has access to [employer] for itchy/watery eyes.    Review of systems: Review of Systems  Constitutional: Negative for chills, fever and malaise/fatigue.  HENT: Positive for congestion. Negative for ear discharge, nosebleeds and sore throat.   Eyes: Negative for pain, discharge and redness.  Respiratory: Negative for cough, shortness of breath and wheezing.   Cardiovascular: Negative for chest pain.  Gastrointestinal: Negative for abdominal pain, constipation, diarrhea, heartburn, nausea and vomiting.  Musculoskeletal: Negative for joint pain.  Skin: Negative for itching and rash.  Neurological: Negative for headaches.    All other systems negative unless noted above in HPI  Past medical/social/surgical/family history have been reviewed and are unchanged unless specifically indicated below.  No changes  Medication List: Allergies as of 09/15/2018      Reactions   Metformin And Related Nausea And Vomiting      Medication List       Accurate as of September 15, 2018  6:18 PM. Always use your most recent med list.        azelastine 0.1 % nasal spray Commonly known as:  ASTELIN Place 2 sprays into both nostrils 2 (two) times daily.   bimatoprost 0.03 % ophthalmic solution Commonly known as:  LUMIGAN Place 1 drop into both eyes at bedtime.   cholecalciferol 1000 units tablet Commonly known as:  VITAMIN D Take 1,000 Units by mouth daily.   cyclobenzaprine 5 MG tablet Commonly known as:  FLEXERIL Take 1 tablet (5 mg total) by mouth 3 (three) times daily as needed for muscle spasms.   FARXIGA 10 MG Tabs tablet Generic drug:  dapagliflozin propanediol Take 10 mg by mouth daily.   fexofenadine-pseudoephedrine 60-120 MG 12 hr tablet Commonly known as:  ALLEGRA-D Take 1 tablet by mouth every 12 (twelve) hours.   ibuprofen 200 MG tablet Commonly known as:  ADVIL,MOTRIN Take 200 mg by mouth every 6 (six) hours as needed.   ipratropium 0.06 % nasal spray Commonly known as:  ATROVENT Place 2 sprays into both nostrils every 4 (four) hours as needed for rhinitis.   LIPITOR PO Take by mouth.   mometasone 50 MCG/ACT nasal spray Commonly known as:  NASONEX Place 2 sprays into the nose daily.   multivitamin with minerals Tabs tablet Take 1 tablet by mouth daily.   multivitamins with iron Tabs tablet Take 1 tablet by mouth daily.   norgestimate-ethinyl estradiol 0.25-35 MG-MCG tablet Commonly known as:  SPRINTEC 28 Take 1 tablet by mouth daily.   Olopatadine HCl 0.7 % Soln Commonly known as:  PAZEO Place 1 drop into both eyes daily  as needed.   pioglitazone 15 MG tablet Commonly known as:  ACTOS Take 15 mg by mouth daily.       Known medication allergies: Allergies  Allergen Reactions  . Metformin And Related Nausea And Vomiting    Physical examination: Blood pressure 122/82, pulse (!) 103, resp. rate 16, height 4\' 11"  (1.499 m), weight 186 lb (84.4 kg), SpO2 98 %.  General: Alert, interactive, in no acute distress. HEENT: PERRLA, TMs pearly gray, turbinates moderately edematous  without discharge, post-pharynx non erythematous. Neck: Supple without lymphadenopathy. Lungs: Clear to auscultation without wheezing, rhonchi or rales. {no increased work of breathing. CV: Normal S1, S2 without murmurs. Abdomen: Nondistended, nontender. Skin: Warm and dry, without lesions or rashes. Extremities:  No clubbing, cyanosis or edema. Neuro:   Grossly intact.  Diagnositics/Labs: None today  Assessment and plan:   Allergic rhinitis with conjunctivitis - continue avoidance measures for  dust mites - continue long-acting antihistamines - Allegra (fexofenadine) 180mg  daily as needed - for nasal congestion use Flonase or Nasacort 2 sprays each nostril daily.  Use for 1-2 weeks at a time before stopping once symptoms improve - for nasal drainage/post-nasal drip use nasal antihistamine, Astelin 2 sprays each nostril up to twice a day as needed - reserve your nasal atrovent for when you have breakthrough symptoms of nasal congestion and/or drainage - for itchy/watery/red eyes use Pazeo 1 drop each eye as needed - we discussed allergen immunotherapy (allergy shots) for management of dust mite allergy if above medication regimen is not effective in controlling symptoms.   - provided with letter to provide to her apartment complex discussing her dust mite allergy and request for carpet removal to decrease allergen exposure.    Follow-up 6 months or sooner if needed  I appreciate the opportunity to take part in Athalie's care. Please do not hesitate to contact me with questions.  Sincerely,   Margo Aye, MD Allergy/Immunology [facility]

## 2018-09-15 NOTE — Patient Instructions (Addendum)
Allergic rhinitis with conjunctivitis - continue avoidance measures for  dust mites - continue long-acting antihistamines - Allegra (fexofenadine) 180mg  daily as needed - for nasal congestion use Flonase or Nasacort 2 sprays each nostril daily.  Use for 1-2 weeks at a time before stopping once symptoms improve - for nasal drainage/post-nasal drip use nasal antihistamine, Astelin 2 sprays each nostril up to twice a day as needed - reserve your nasal atrovent for when you have breakthrough symptoms of nasal congestion and/or drainage - for itchy/watery/red eyes use Pazeo 1 drop each eye as needed - we discussed allergen immunotherapy (allergy shots) for management of dust mite allergy if above medication regimen is not effective in controlling symptoms.    Follow-up 6 months or sooner if needed

## 2018-10-18 NOTE — Progress Notes (Signed)
  Patient referred by Swayne, David, MD for palpitations  Subjective:   Sylvia Roberts, female    DOB: 02/12/1981, 38 y.o.   MRN: [id number]   Chief Complaint  Patient presents with  . Palpitations  . New Patient (Initial Visit)    HPI  38 y.o. African American female with type 2 DM, hyperlidemia, glaucoma here for palpitations.  She has been having palpitations since Dec 2019. They occur for short period, unrelated to exertion, increased with anxiety. She denies any associated chest pain, shortness of breath, presyncope or syncope. She stopped all caffinated drinks for a week with no improvement.    She lives with her daughter. 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 She is currently trying to figure out how both of the above would change in the COVID-19 times.  Past Medical History:  Diagnosis Date  . Diabetes mellitus   . Glaucoma   . Urticaria      Past Surgical History:  Procedure Laterality Date  . CESAREAN SECTION    . CESAREAN SECTION    . COLPOSCOPY Bilateral 08/02/2001  . REFRACTIVE SURGERY Bilateral      Social History   Socioeconomic History  . Marital status: Single    Spouse name: Not on file  . Number of children: 1  . Years of education: Not on file  . Highest education level: Not on file  Occupational History  . Not on file  Social Needs  . Financial resource strain: Not on file  . Food insecurity:    Worry: Not on file    Inability: Not on file  . Transportation needs:    Medical: Not on file    Non-medical: Not on file  Tobacco Use  . Smoking status: Former Smoker    Packs/day: 0.10    Types: Cigarettes    Last attempt to quit: 12/04/2010    Years since quitting: 7.8  . Smokeless tobacco: Never Used  Substance and Sexual Activity  . Alcohol use: Yes    Alcohol/week: 2.0 - 3.0 standard drinks    Types: 2 - 3 Glasses of wine per week  . Drug use: Not Currently  . Sexual activity: Yes   Lifestyle  . Physical activity:    Days per week: Not on file    Minutes per session: Not on file  . Stress: Not on file  Relationships  . Social connections:    Talks on phone: Not on file    Gets together: Not on file    Attends religious service: Not on file    Active member of club or organization: Not on file    Attends meetings of clubs or organizations: Not on file    Relationship status: Not on file  . Intimate partner violence:    Fear of current or ex partner: Not on file    Emotionally abused: Not on file    Physically abused: Not on file    Forced sexual activity: Not on file  Other Topics Concern  . Not on file  Social History Narrative  . Not on file     Current Outpatient Medications on File Prior to Visit  Medication Sig Dispense Refill  . Atorvastatin Calcium (LIPITOR PO) Take 10 mg by mouth.     . azelastine (ASTELIN) 0.1 % nasal spray Place 2 sprays into both nostrils 2 (two) times daily. 30 mL 5  . bimatoprost (LUMIGAN) 0.03 % ophthalmic solution Place 1   drop into both eyes at bedtime.    . cholecalciferol (VITAMIN D) 1000 units tablet Take 1,000 Units by mouth daily.    . cyclobenzaprine (FLEXERIL) 5 MG tablet Take 1 tablet (5 mg total) by mouth 3 (three) times daily as needed for muscle spasms. 30 tablet 0  . dapagliflozin propanediol (FARXIGA) 10 MG TABS tablet Take 10 mg by mouth daily.    . fexofenadine-pseudoephedrine (ALLEGRA-D) 60-120 MG per tablet Take 1 tablet by mouth every 12 (twelve) hours. 30 tablet 2  . ibuprofen (ADVIL,MOTRIN) 200 MG tablet Take 200 mg by mouth every 6 (six) hours as needed.    Marland Kitchen ipratropium (ATROVENT) 0.06 % nasal spray Place 2 sprays into both nostrils every 4 (four) hours as needed for rhinitis. 15 mL 1  . mometasone (NASONEX) 50 MCG/ACT nasal spray Place 2 sprays into the nose daily. 17 g 12  . Multiple Vitamin (MULTIVITAMIN WITH MINERALS) TABS tablet Take 1 tablet by mouth daily.    . Olopatadine HCl (PAZEO) 0.7 % SOLN  Place 1 drop into both eyes daily as needed. 1 Bottle 5  . pioglitazone (ACTOS) 15 MG tablet Take 15 mg by mouth daily.    Marland Kitchen zolpidem (AMBIEN) 5 MG tablet Take 1 tablet by mouth as needed.    . Multiple Vitamins-Iron (MULTIVITAMINS WITH IRON) TABS tablet Take 1 tablet by mouth daily.    . norgestimate-ethinyl estradiol (SPRINTEC 28) 0.25-35 MG-MCG tablet Take 1 tablet by mouth daily. 1 Package 11   No current facility-administered medications on file prior to visit.     Cardiovascular studies:  EKG 10/19/2018: Sinus rhythm 88 bpm. Normal EKG  Recent labs: Labs 04/30/2017 Glucose 212. BUN/Cr 13/0.81. eGFR >60. Na/K 138/4.0. AST 12. ALT 12. Rest of CMP normal.  09/10/2017: HbA1c 7.6. 04/30/2017: TSH normal. Free T4 normal. LDL 126.   Review of Systems  Constitution: Negative for decreased appetite, malaise/fatigue, weight gain and weight loss.  HENT: Negative for congestion.   Eyes: Negative for visual disturbance.  Cardiovascular: Positive for palpitations. Negative for chest pain, dyspnea on exertion, leg swelling and syncope.  Respiratory: Negative for shortness of breath.   Endocrine: Negative for cold intolerance.  Hematologic/Lymphatic: Does not bruise/bleed easily.  Skin: Negative for itching and rash.  Musculoskeletal: Negative for myalgias.  Gastrointestinal: Negative for abdominal pain, nausea and vomiting.  Genitourinary: Negative for dysuria.  Neurological: Negative for dizziness and weakness.  Psychiatric/Behavioral: The patient is not nervous/anxious.   All other systems reviewed and are negative.        Vitals:   10/19/18 1541  BP: 105/67  Pulse: 88  SpO2: 97%    Objective:   Physical Exam  Constitutional: She is oriented to person, place, and time. She appears well-developed and well-nourished. No distress.  HENT:  Head: Normocephalic and atraumatic.  Eyes: Pupils are equal, round, and reactive to light. Conjunctivae are normal.  Neck: No JVD  present.  Cardiovascular: Normal rate, regular rhythm and intact distal pulses.  Pulmonary/Chest: Effort normal and breath sounds normal. She has no wheezes. She has no rales.  Abdominal: Soft. Bowel sounds are normal. There is no rebound.  Musculoskeletal:        General: No edema.  Lymphadenopathy:    She has no cervical adenopathy.  Neurological: She is alert and oriented to person, place, and time. No cranial nerve deficit.  Skin: Skin is warm and dry.  Psychiatric: She has a normal mood and affect.  Nursing note and vitals reviewed.  Assessment & Recommendations:   38 y.o. African American female with type 2 DM, hyperlidemia, glaucoma here for palpitations.  1. Palpitations Suspect benign etiology. Recommend echocardiogram and event monitor. Anxiety may be playing a role  2. Essential hypertension Controlled.  Follow up as needed unless significant abnormalities found on the above testing.   Nigel Mormon, MD [facility] Pager: [phone] Office: [phone] If no answer Cell [phone]

## 2018-10-19 ENCOUNTER — Ambulatory Visit: Payer: Medicaid Other

## 2018-10-19 ENCOUNTER — Other Ambulatory Visit: Payer: Self-pay

## 2018-10-19 ENCOUNTER — Ambulatory Visit (INDEPENDENT_AMBULATORY_CARE_PROVIDER_SITE_OTHER): Payer: BC Managed Care – PPO | Admitting: Cardiology

## 2018-10-19 ENCOUNTER — Encounter: Payer: Self-pay | Admitting: Cardiology

## 2018-10-19 VITALS — BP 105/67 | HR 88 | Ht 59.0 in | Wt 179.1 lb

## 2018-10-19 DIAGNOSIS — R002 Palpitations: Secondary | ICD-10-CM

## 2018-10-19 DIAGNOSIS — I10 Essential (primary) hypertension: Secondary | ICD-10-CM | POA: Diagnosis not present

## 2018-10-20 ENCOUNTER — Encounter: Payer: Self-pay | Admitting: Cardiology

## 2018-10-20 DIAGNOSIS — R002 Palpitations: Secondary | ICD-10-CM | POA: Insufficient documentation

## 2018-10-20 DIAGNOSIS — I10 Essential (primary) hypertension: Secondary | ICD-10-CM

## 2018-10-20 HISTORY — DX: Essential (primary) hypertension: I10

## 2018-10-25 ENCOUNTER — Other Ambulatory Visit: Payer: Medicaid Other

## 2018-11-17 DIAGNOSIS — R002 Palpitations: Secondary | ICD-10-CM

## 2018-11-22 NOTE — Progress Notes (Signed)
Left a message for pt to call back

## 2018-11-28 ENCOUNTER — Other Ambulatory Visit: Payer: Medicaid Other

## 2019-01-23 ENCOUNTER — Other Ambulatory Visit: Payer: Medicaid Other

## 2019-03-01 ENCOUNTER — Ambulatory Visit (INDEPENDENT_AMBULATORY_CARE_PROVIDER_SITE_OTHER): Payer: BC Managed Care – PPO

## 2019-03-01 ENCOUNTER — Other Ambulatory Visit: Payer: Self-pay

## 2019-03-01 DIAGNOSIS — R002 Palpitations: Secondary | ICD-10-CM | POA: Diagnosis not present

## 2019-03-01 DIAGNOSIS — I10 Essential (primary) hypertension: Secondary | ICD-10-CM | POA: Diagnosis not present

## 2019-04-19 ENCOUNTER — Other Ambulatory Visit: Payer: Self-pay

## 2019-04-19 MED ORDER — AZELASTINE HCL 0.1 % NA SOLN: 2.0000 | Freq: Two times a day (BID) | NASAL | 0 refills | Status: AC

## 2023-06-12 ENCOUNTER — Ambulatory Visit (HOSPITAL_COMMUNITY)
Admission: EM | Admit: 2023-06-12 | Discharge: 2023-06-12 | Disposition: A | Discharge status: Home / Self Care | Payer: BC Managed Care – PPO

## 2023-06-12 ENCOUNTER — Encounter (HOSPITAL_COMMUNITY): Payer: Self-pay

## 2023-06-12 DIAGNOSIS — R052 Subacute cough: Secondary | ICD-10-CM

## 2023-06-12 MED ORDER — PREDNISONE 10 MG PO TABS: ORAL_TABLET | ORAL | 0 refills | Status: AC

## 2023-06-12 MED ORDER — PROMETHAZINE-DM 6.25-15 MG/5ML PO SYRP: 5.0000 mL | ORAL_SOLUTION | Freq: Four times a day (QID) | ORAL | 0 refills | Status: AC | PRN

## 2023-06-12 NOTE — Discharge Instructions (Addendum)
Likely lingering cough from Upper respiratory infection. Will try a steroid to improve inflammation in the airways. Can also try a cough medication to help with the cough at night or during the day.  Prednisone taper. Day 1 50mg , day 2 40mg , day 3 30mg , day 4 20mg , day 5 10mg  Cough 5 mLs every 6 hours as needed for cough. Use caution as this medication can make you drowsy.  Return to urgent care or PCP if symptoms worsen or fail to resolve.

## 2023-06-12 NOTE — ED Provider Notes (Signed)
[facility]    CSN: [id number] Arrival date & time: 06/12/23  1612      History   Chief Complaint Chief Complaint  Patient presents with   Cough    HPI Sylvia Roberts is a 42 y.o. female.   42 yr female who presents to urgent care with complaints of a cough for about 4 weeks that has some mucus with it. Had covid about 1.5 month ago and has not had resolution of the cough. Cough is mainly at night but can be during the day as well. Patient has been treating it with OTC medications. Had covid previously with a lingering cough but not this long.   Also is having some mild nasal congestion and ear congestion.  Takes allergy pills daily. Her PCP tells her to switch up which one she uses after a while.   PCP at [facility].     Cough Associated symptoms: ear pain   Associated symptoms: no chest pain, no chills, no fever, no rash, no shortness of breath and no sore throat     Past Medical History:  Diagnosis Date   Diabetes mellitus    Essential hypertension 10/20/2018   Glaucoma    Urticaria     Patient Active Problem List   Diagnosis Date Noted   Palpitations 10/20/2018   Essential hypertension 10/20/2018   Contraceptive management 07/02/2011   Well woman exam with routine gynecological exam 07/01/2011   Obesity 07/01/2011   Diabetes mellitus (HCC) 03/06/2011   History of tobacco abuse 03/06/2011    Past Surgical History:  Procedure Laterality Date   CESAREAN SECTION     CESAREAN SECTION     COLPOSCOPY Bilateral 08/02/2001   REFRACTIVE SURGERY Bilateral     OB History   No obstetric history on file.      Home Medications    Prior to Admission medications   Medication Sig Start Date End Date Taking? Authorizing Provider  Atorvastatin Calcium (LIPITOR PO) Take 10 mg by mouth.    Yes [provider]  bimatoprost (LUMIGAN) 0.03 % ophthalmic solution Place 1 drop into both eyes at bedtime.   Yes [provider]  cholecalciferol  (VITAMIN D) 1000 units tablet Take 1,000 Units by mouth daily.   Yes [provider]  cyclobenzaprine (FLEXERIL) 5 MG tablet Take 1 tablet (5 mg total) by mouth 3 (three) times daily as needed for muscle spasms. 09/24/15  Yes Kindl, Quita Skye, MD  dapagliflozin propanediol (FARXIGA) 10 MG TABS tablet Take 10 mg by mouth daily.   Yes [provider]  fexofenadine-pseudoephedrine (ALLEGRA-D) 60-120 MG per tablet Take 1 tablet by mouth every 12 (twelve) hours. 09/10/14  Yes Baker, Zachary H, PA-C  ibuprofen (ADVIL,MOTRIN) 200 MG tablet Take 200 mg by mouth every 6 (six) hours as needed.   Yes [provider]  mometasone (NASONEX) 50 MCG/ACT nasal spray Place 2 sprays into the nose daily. 09/10/14  Yes Baker, Adrian Blackwater, PA-C  Multiple Vitamins-Iron (MULTIVITAMINS WITH IRON) TABS tablet Take 1 tablet by mouth daily.   Yes [provider]  Olopatadine HCl (PAZEO) 0.7 % SOLN Place 1 drop into both eyes daily as needed. 03/21/18  Yes Padgett, Pilar Grammes, MD  Semaglutide (RYBELSUS) 7 MG TABS Take 1 tablet daily on empty stomach 1/2 hour before breakfast. 09/18/22  Yes [provider]  zolpidem (AMBIEN) 5 MG tablet Take 1 tablet by mouth as needed. 09/12/18  Yes [provider]  azelastine (ASTELIN) 0.1 % nasal  spray Place 2 sprays into both nostrils 2 (two) times daily. 04/19/19   Marcelyn Bruins, MD  ipratropium (ATROVENT) 0.06 % nasal spray Place 2 sprays into both nostrils every 4 (four) hours as needed for rhinitis. 09/10/14   Graylon Good, PA-C  Multiple Vitamin (MULTIVITAMIN WITH MINERALS) TABS tablet Take 1 tablet by mouth daily.    [provider]  norgestimate-ethinyl estradiol (SPRINTEC 28) 0.25-35 MG-MCG tablet Take 1 tablet by mouth daily. 07/01/11 06/30/12  Ardyth Gal, MD  pioglitazone (ACTOS) 15 MG tablet Take 15 mg by mouth daily.    [provider]    Family History Family History  Problem Relation Age  of Onset   Anemia Mother    Diabetes Maternal Aunt    Hypertension Maternal Aunt    Diabetes Paternal Aunt    Hypertension Paternal Aunt    Cancer Maternal Grandmother    Cancer Maternal Grandfather    Diabetes Paternal Grandmother    Hypertension Paternal Grandmother    Stroke Paternal Grandfather     Social History Social History   Tobacco Use   Smoking status: Former    Current packs/day: 0.00    Types: Cigarettes    Quit date: 12/04/2010    Years since quitting: 12.5   Smokeless tobacco: Never  Vaping Use   Vaping status: Never Used  Substance Use Topics   Alcohol use: Yes    Alcohol/week: 2.0 - 3.0 standard drinks of alcohol    Types: 2 - 3 Glasses of wine per week   Drug use: Not Currently     Allergies   Metformin and related   Review of Systems Review of Systems  Constitutional:  Negative for chills and fever.  HENT:  Positive for congestion and ear pain. Negative for sore throat.   Eyes:  Negative for pain and visual disturbance.  Respiratory:  Positive for cough. Negative for shortness of breath.   Cardiovascular:  Negative for chest pain and palpitations.  Gastrointestinal:  Negative for abdominal pain and vomiting.  Genitourinary:  Negative for dysuria and hematuria.  Musculoskeletal:  Negative for arthralgias and back pain.  Skin:  Negative for color change and rash.  Neurological:  Negative for seizures and syncope.  All other systems reviewed and are negative.    Physical Exam Triage Vital Signs ED Triage Vitals  Encounter Vitals Group     BP 06/12/23 1735 114/74     Systolic BP Percentile --      Diastolic BP Percentile --      Pulse Rate 06/12/23 1735 80     Resp 06/12/23 1735 16     Temp 06/12/23 1735 98.4 F (36.9 C)     Temp Source 06/12/23 1735 Oral     SpO2 06/12/23 1735 98 %     Weight 06/12/23 1734 179 lb 0.2 oz (81.2 kg)     Height 06/12/23 1734 4\' 11"  (1.499 m)     Head Circumference --      Peak Flow --      Pain Score  06/12/23 1730 0     Pain Loc --      Pain Education --      Exclude from Growth Chart --    No data found.  Updated Vital Signs BP 114/74 (BP Location: Right Arm)   Pulse 80   Temp 98.4 F (36.9 C) (Oral)   Resp 16   Ht 4\' 11"  (1.499 m)   Wt 179 lb 0.2 oz (81.2 kg)  LMP 06/05/2023 (Exact Date)   SpO2 98%   BMI 36.16 kg/m   Visual Acuity Right Eye Distance:   Left Eye Distance:   Bilateral Distance:    Right Eye Near:   Left Eye Near:    Bilateral Near:     Physical Exam Vitals and nursing note reviewed.  Constitutional:      General: She is not in acute distress.    Appearance: She is well-developed.  HENT:     Head: Normocephalic and atraumatic.  Eyes:     Conjunctiva/sclera: Conjunctivae normal.  Cardiovascular:     Rate and Rhythm: Normal rate and regular rhythm.     Heart sounds: No murmur heard. Pulmonary:     Effort: Pulmonary effort is normal. No respiratory distress.     Breath sounds: Normal breath sounds.  Abdominal:     Palpations: Abdomen is soft.     Tenderness: There is no abdominal tenderness.  Musculoskeletal:        General: No swelling.     Cervical back: Neck supple.  Skin:    General: Skin is warm and dry.     Capillary Refill: Capillary refill takes less than 2 seconds.  Neurological:     Mental Status: She is alert.  Psychiatric:        Mood and Affect: Mood normal.      UC Treatments / Results  Labs (all labs ordered are listed, but only abnormal results are displayed) Labs Reviewed - No data to display  EKG   Radiology No results found.  Procedures Procedures (including critical care time)  Medications Ordered in UC Medications - No data to display  Initial Impression / Assessment and Plan / UC Course  I have reviewed the triage vital signs and the nursing notes.  Pertinent labs & imaging results that were available during my care of the patient were reviewed by me and considered in my medical decision making  (see chart for details).     Subacute cough   Likely lingering cough from Upper respiratory infection. Will try a steroid to improve inflammation in the airways. Can also try a cough medication to help with the cough at night or during the day.  Prednisone taper. Day 1 50mg , day 2 40mg , day 3 30mg , day 4 20mg , day 5 10mg  Cough 5 mLs every 6 hours as needed for cough. Use caution as this medication can make you drowsy.  Return to urgent care or PCP if symptoms worsen or fail to resolve.    Final Clinical Impressions(s) / UC Diagnoses   Final diagnoses:  None   Discharge Instructions   None    ED Prescriptions   None    PDMP not reviewed this encounter.   Quintella Reichert 06/12/23 1758

## 2023-06-12 NOTE — ED Triage Notes (Addendum)
Dry cough mainly with slight yellow production at times and chest congestion onset 4 weeks ago. Patient had COVID 1.5 months ago.   Patient has been trying Father John syrup, teas, Mucinex, and allergy pills.

## 2024-09-08 ENCOUNTER — Ambulatory Visit (HOSPITAL_COMMUNITY)
Admission: EM | Admit: 2024-09-08 | Discharge: 2024-09-08 | Disposition: A | Discharge status: Home / Self Care | Source: Home / Self Care

## 2024-09-08 ENCOUNTER — Encounter (HOSPITAL_COMMUNITY): Payer: Self-pay | Admitting: Emergency Medicine

## 2024-09-08 ENCOUNTER — Ambulatory Visit (HOSPITAL_COMMUNITY)

## 2024-09-08 DIAGNOSIS — W009XXA Unspecified fall due to ice and snow, initial encounter: Secondary | ICD-10-CM

## 2024-09-08 DIAGNOSIS — M62830 Muscle spasm of back: Secondary | ICD-10-CM

## 2024-09-08 DIAGNOSIS — M25562 Pain in left knee: Secondary | ICD-10-CM

## 2024-09-08 MED ORDER — BACLOFEN 10 MG PO TABS: 10.0000 mg | ORAL_TABLET | Freq: Three times a day (TID) | ORAL | 0 refills | Status: AC

## 2024-09-08 MED ORDER — NAPROXEN 375 MG PO TABS: 375.0000 mg | ORAL_TABLET | Freq: Two times a day (BID) | ORAL | 0 refills | Status: AC

## 2024-09-08 NOTE — ED Provider Notes (Signed)
 " [facility]    CSN: [id number] Arrival date & time: 09/08/24  1807      History   Chief Complaint No chief complaint on file.   HPI Sylvia Roberts is a 44 y.o. female.   Sylvia Roberts is a 44 y.o. female presenting for evaluation after she fell on ice today while walking into work. She tripped and fell forward landing onto her knees. Her left knee hit the concrete hardest and she is having pain to both sides of the left knee. Also complains of pain to the left low back. She did not hit her back or buttocks during fall, states her back feels stiff. Denies previous injuries to the left knee, paresthesias, extremity weakness, loss of bowel/bladder control, saddle anesthesia, dizziness, headache. She did not hit her head during fall and denies LOC. She was able to stand up after falling with the help of coworkers.      Past Medical History:  Diagnosis Date   Diabetes mellitus    Essential hypertension 10/20/2018   Glaucoma    Urticaria     Patient Active Problem List   Diagnosis Date Noted   Palpitations 10/20/2018   Essential hypertension 10/20/2018   Contraceptive management 07/02/2011   Well woman exam with routine gynecological exam 07/01/2011   Obesity 07/01/2011   Diabetes mellitus (HCC) 03/06/2011   History of tobacco abuse 03/06/2011    Past Surgical History:  Procedure Laterality Date   CESAREAN SECTION     CESAREAN SECTION     COLPOSCOPY Bilateral 08/02/2001   REFRACTIVE SURGERY Bilateral     OB History   No obstetric history on file.      Home Medications    Prior to Admission medications  Medication Sig Start Date End Date Taking? Authorizing Provider  baclofen  (LIORESAL ) 10 MG tablet Take 1 tablet (10 mg total) by mouth 3 (three) times daily. 09/08/24  Yes Enedelia Dorna HERO, FNP  naproxen  (NAPROSYN ) 375 MG tablet Take 1 tablet (375 mg total) by mouth 2 (two) times daily. 09/08/24  Yes Enedelia Dorna HERO, FNP  Atorvastatin  Calcium (LIPITOR PO) Take 10 mg by mouth.     [provider]  azelastine  (ASTELIN ) 0.1 % nasal spray Place 2 sprays into both nostrils 2 (two) times daily. 04/19/19   Jeneal Danita Macintosh, MD  bimatoprost (LUMIGAN) 0.03 % ophthalmic solution Place 1 drop into both eyes at bedtime.    [provider]  cholecalciferol (VITAMIN D) 1000 units tablet Take 1,000 Units by mouth daily.    [provider]  dapagliflozin propanediol (FARXIGA) 10 MG TABS tablet Take 10 mg by mouth daily.    [provider]  fexofenadine -pseudoephedrine (ALLEGRA-D) 60-120 MG per tablet Take 1 tablet by mouth every 12 (twelve) hours. 09/10/14   Lennie, Zachary H, PA-C  fluticasone (FLONASE) 50 MCG/ACT nasal spray 1 spray in each nostril Nasally Once a day; Duration: 30 days    [provider]  ibuprofen (ADVIL,MOTRIN) 200 MG tablet Take 200 mg by mouth every 6 (six) hours as needed.    [provider]  ipratropium (ATROVENT ) 0.06 % nasal spray Place 2 sprays into both nostrils every 4 (four) hours as needed for rhinitis. 09/10/14   Baker, Zachary H, PA-C  mometasone  (NASONEX ) 50 MCG/ACT nasal spray Place 2 sprays into the nose daily. 09/10/14   Baker, Zachary H, PA-C  Multiple Vitamin (MULTI VITAMIN) TABS 1 tablet Orally Once a day    [provider]  Multiple Vitamin (MULTIVITAMIN WITH MINERALS) TABS tablet Take 1 tablet by mouth daily.    [provider]  Multiple Vitamins-Iron (MULTIVITAMINS WITH IRON) TABS tablet Take 1 tablet by mouth daily.    [provider]  norgestimate -ethinyl estradiol  (SPRINTEC 28) 0.25-35 MG-MCG tablet Take 1 tablet by mouth daily. 07/01/11 06/30/12  Feliciana Millman, MD  Olopatadine  HCl (PAZEO) 0.7 % SOLN Place 1 drop into both eyes daily as needed. 03/21/18   Jeneal Danita Macintosh, MD  pioglitazone (ACTOS) 15 MG tablet Take 15 mg by mouth daily.    [provider]  promethazine -dextromethorphan  (PROMETHAZINE -DM) 6.25-15 MG/5ML syrup Take 5 mLs by mouth every 6 (six) hours as needed for cough. 06/12/23   White, Elizabeth A, PA-C  Semaglutide (RYBELSUS) 7 MG TABS Take 1 tablet daily on empty stomach 1/2 hour before breakfast. 09/18/22   [provider]  zolpidem (AMBIEN) 5 MG tablet Take 1 tablet by mouth as needed. 09/12/18   [provider]    Family History Family History  Problem Relation Age of Onset   Anemia Mother    Diabetes Maternal Aunt    Hypertension Maternal Aunt    Diabetes Paternal Aunt    Hypertension Paternal Aunt    Cancer Maternal Grandmother    Cancer Maternal Grandfather    Diabetes Paternal Grandmother    Hypertension Paternal Grandmother    Stroke Paternal Grandfather     Social History Social History[1]   Allergies   Metformin and related   Review of Systems Review of Systems Per HPI  Physical Exam Triage Vital Signs ED Triage Vitals  Encounter Vitals Group     BP 09/08/24 1832 (!) 143/90     Girls Systolic BP Percentile --      Girls Diastolic BP Percentile --      Boys Systolic BP Percentile --      Boys Diastolic BP Percentile --      Pulse Rate 09/08/24 1832 100     Resp 09/08/24 1832 15     Temp 09/08/24 1832 98.2 F (36.8 C)     Temp Source 09/08/24 1832 Oral     SpO2 09/08/24 1832 98 %     Weight --      Height --      Head Circumference --      Peak Flow --      Pain Score 09/08/24 1830 8     Pain Loc --      Pain Education --      Exclude from Growth Chart --    No data found.  Updated Vital Signs BP (!) 143/90 (BP Location: Right Arm)   Pulse 100   Temp 98.2 F (36.8 C) (Oral)   Resp 15   SpO2 98%   Visual Acuity Right Eye Distance:   Left Eye Distance:   Bilateral Distance:    Right Eye Near:   Left Eye Near:    Bilateral Near:     Physical Exam Musculoskeletal:     Cervical back: Normal.     Thoracic back: Normal.     Lumbar back: Spasms and tenderness present. No swelling, edema,  deformity, signs of trauma, lacerations or bony tenderness. Normal range of motion. Negative right straight leg raise test and negative left straight leg raise test. No scoliosis.     Right knee: Normal.     Left knee: No swelling, deformity, effusion, erythema, ecchymosis or lacerations. Normal range of motion. Tenderness present over the medial joint  line and lateral joint line. No patellar tendon tenderness. Normal pulse.     Comments: 5/5 strength against resistance with flexion and extension at the left knee joint, +2 left popliteal pulse, sensation intact to distal bilateral lower extremities. Ambulatory with steady gait without limp.       UC Treatments / Results  Labs (all labs ordered are listed, but only abnormal results are displayed) Labs Reviewed - No data to display  EKG   Radiology DG Knee Complete 4 Views Left Result Date: 09/08/2024 EXAM: 4 VIEW(S) XRAY OF THE LEFT KNEE 09/08/2024 07:21:16 PM COMPARISON: None available. CLINICAL HISTORY: Fall on ice. FINDINGS: BONES AND JOINTS: No acute fracture. No malalignment. No significant joint effusion. SOFT TISSUES: Unremarkable. IMPRESSION: 1. No evidence of acute traumatic injury. Electronically signed by: Pinkie Pebbles MD 09/08/2024 07:27 PM EST RP Workstation: [id number]    Procedures Procedures (including critical care time)  Medications Ordered in UC Medications - No data to display  Initial Impression / Assessment and Plan / UC Course  I have reviewed the triage vital signs and the nursing notes.  Pertinent labs & imaging results that were available during my care of the patient were reviewed by me and considered in my medical decision making (see chart for details).   1. Fall due to slipping on ice or snow, acute pain of left knee, spasm of lumbar paraspinous muscle Evaluation suggests low back pain is due to muscle spasm. No direct trauma to the low back, therefore we did not perform imaging of the lumbar spine  today. Will manage this with rest, gentle ROM exercises, heat therapy, baclofen  as needed for pain, and as needed use of muscle relaxer. Drowsiness precautions discussed regarding muscle relaxer use. Left knee x-rays normal, no acute bony abnormality.  May follow-up with orthopedics as needed. Work/school excise note given.  Counseled patient on potential for adverse effects with medications prescribed/recommended today, strict ER and return-to-clinic precautions discussed, patient verbalized understanding.    Final Clinical Impressions(s) / UC Diagnoses   Final diagnoses:  Fall due to slipping on ice or snow, initial encounter  Acute pain of left knee  Spasm of lumbar paraspinous muscle     Discharge Instructions      Your back pain is likely due to a muscle strain which will improve on its own with time.   Left knee x-ray is normal, no signs of bony abnormality.   Take naproxen  every 12 hours as needed for pain and inflammation.  You may take tylenol as needed for aches and pains.  Take muscle relaxer as needed for muscle spasm, mostly take this at bedtime as this medicine can cause drowsiness.  Apply heat to the pulled muscle 20 minutes on 20 minutes off as needed, heat relaxes muscles.  Perform gentle exercises and stretches to area of tenderness.  I would like for you to rest, however I do not want you to avoid moving the area. Movement and stretching will help with healing.  Red flag symptoms to watch out for are numbness/tingling to the legs, weakness, loss of bowel/bladder control, and/or worsening pain that does not respond well to medicines.  Follow-up with your primary care provider or return to urgent care if your symptoms do not improve in the next 3 to 4 days with medications and interventions recommended today. If your symptoms are severe (red flag), please go to the emergency room.        ED Prescriptions     Medication Sig Dispense  Auth. Provider    baclofen  (LIORESAL ) 10 MG tablet Take 1 tablet (10 mg total) by mouth 3 (three) times daily. 30 each Enedelia Dorna HERO, FNP   naproxen  (NAPROSYN ) 375 MG tablet Take 1 tablet (375 mg total) by mouth 2 (two) times daily. 20 tablet Enedelia Dorna HERO, FNP      PDMP not reviewed this encounter.      [1]  Social History Tobacco Use   Smoking status: Former    Current packs/day: 0.00    Average packs/day: 0.1 packs/day    Types: Cigarettes    Quit date: 12/04/2010    Years since quitting: 13.7   Smokeless tobacco: Never  Vaping Use   Vaping status: Never Used  Substance Use Topics   Alcohol use: Yes    Alcohol/week: 2.0 - 3.0 standard drinks of alcohol    Types: 2 - 3 Glasses of wine per week   Drug use: Not Currently     Enedelia Dorna HERO, FNP 09/08/24 1940  "

## 2024-09-08 NOTE — ED Triage Notes (Signed)
 Pt reports that she slipped in parking lot of her work this morning. Reports hurt left knee and back in fall. Left lower back is what is hurting.  Pt has paperwork from her job that needs to be filled out.

## 2024-09-08 NOTE — Discharge Instructions (Signed)
 Your back pain is likely due to a muscle strain which will improve on its own with time.   Left knee x-ray is normal, no signs of bony abnormality.   Take naproxen  every 12 hours as needed for pain and inflammation.  You may take tylenol as needed for aches and pains.  Take muscle relaxer as needed for muscle spasm, mostly take this at bedtime as this medicine can cause drowsiness.  Apply heat to the pulled muscle 20 minutes on 20 minutes off as needed, heat relaxes muscles.  Perform gentle exercises and stretches to area of tenderness.  I would like for you to rest, however I do not want you to avoid moving the area. Movement and stretching will help with healing.  Red flag symptoms to watch out for are numbness/tingling to the legs, weakness, loss of bowel/bladder control, and/or worsening pain that does not respond well to medicines.  Follow-up with your primary care provider or return to urgent care if your symptoms do not improve in the next 3 to 4 days with medications and interventions recommended today. If your symptoms are severe (red flag), please go to the emergency room.
